# Patient Record
Sex: Male | Born: 1935 | Race: White | Hispanic: No | Marital: Single | Smoking: Former smoker
Health system: Southern US, Community
[De-identification: ages and names within clinical notes are randomized; demographics above are authoritative.]

## PROBLEM LIST (undated history)

## (undated) DIAGNOSIS — I447 Left bundle-branch block, unspecified: Secondary | ICD-10-CM

## (undated) DIAGNOSIS — D3709 Neoplasm of uncertain behavior of other specified sites of the oral cavity: Secondary | ICD-10-CM

## (undated) DIAGNOSIS — H3530 Unspecified macular degeneration: Secondary | ICD-10-CM

## (undated) DIAGNOSIS — D3705 Neoplasm of uncertain behavior of pharynx: Secondary | ICD-10-CM

## (undated) DIAGNOSIS — F419 Anxiety disorder, unspecified: Secondary | ICD-10-CM

## (undated) DIAGNOSIS — I495 Sick sinus syndrome: Secondary | ICD-10-CM

## (undated) DIAGNOSIS — M545 Low back pain, unspecified: Secondary | ICD-10-CM

## (undated) DIAGNOSIS — G609 Hereditary and idiopathic neuropathy, unspecified: Secondary | ICD-10-CM

## (undated) DIAGNOSIS — K589 Irritable bowel syndrome without diarrhea: Secondary | ICD-10-CM

## (undated) DIAGNOSIS — T7840XA Allergy, unspecified, initial encounter: Secondary | ICD-10-CM

## (undated) DIAGNOSIS — R350 Frequency of micturition: Secondary | ICD-10-CM

## (undated) DIAGNOSIS — M542 Cervicalgia: Secondary | ICD-10-CM

## (undated) DIAGNOSIS — K921 Melena: Secondary | ICD-10-CM

## (undated) DIAGNOSIS — G5600 Carpal tunnel syndrome, unspecified upper limb: Secondary | ICD-10-CM

## (undated) DIAGNOSIS — K219 Gastro-esophageal reflux disease without esophagitis: Secondary | ICD-10-CM

## (undated) DIAGNOSIS — G4700 Insomnia, unspecified: Secondary | ICD-10-CM

## (undated) DIAGNOSIS — D3701 Neoplasm of uncertain behavior of lip: Secondary | ICD-10-CM

## (undated) DIAGNOSIS — R55 Syncope and collapse: Secondary | ICD-10-CM

## (undated) DIAGNOSIS — L821 Other seborrheic keratosis: Secondary | ICD-10-CM

## (undated) DIAGNOSIS — Z950 Presence of cardiac pacemaker: Secondary | ICD-10-CM

## (undated) DIAGNOSIS — M25519 Pain in unspecified shoulder: Secondary | ICD-10-CM

## (undated) DIAGNOSIS — I2510 Atherosclerotic heart disease of native coronary artery without angina pectoris: Secondary | ICD-10-CM

## (undated) DIAGNOSIS — R5381 Other malaise: Secondary | ICD-10-CM

## (undated) DIAGNOSIS — R413 Other amnesia: Secondary | ICD-10-CM

## (undated) DIAGNOSIS — K279 Peptic ulcer, site unspecified, unspecified as acute or chronic, without hemorrhage or perforation: Secondary | ICD-10-CM

## (undated) DIAGNOSIS — K3184 Gastroparesis: Secondary | ICD-10-CM

## (undated) DIAGNOSIS — K5732 Diverticulitis of large intestine without perforation or abscess without bleeding: Secondary | ICD-10-CM

## (undated) DIAGNOSIS — M7741 Metatarsalgia, right foot: Secondary | ICD-10-CM

## (undated) DIAGNOSIS — M7742 Metatarsalgia, left foot: Secondary | ICD-10-CM

## (undated) DIAGNOSIS — D1739 Benign lipomatous neoplasm of skin and subcutaneous tissue of other sites: Secondary | ICD-10-CM

## (undated) DIAGNOSIS — H269 Unspecified cataract: Secondary | ICD-10-CM

## (undated) DIAGNOSIS — K220 Achalasia of cardia: Secondary | ICD-10-CM

## (undated) DIAGNOSIS — L723 Sebaceous cyst: Secondary | ICD-10-CM

## (undated) DIAGNOSIS — B409 Blastomycosis, unspecified: Secondary | ICD-10-CM

## (undated) DIAGNOSIS — M109 Gout, unspecified: Secondary | ICD-10-CM

## (undated) DIAGNOSIS — M1990 Unspecified osteoarthritis, unspecified site: Secondary | ICD-10-CM

## (undated) DIAGNOSIS — I255 Ischemic cardiomyopathy: Secondary | ICD-10-CM

## (undated) DIAGNOSIS — E119 Type 2 diabetes mellitus without complications: Secondary | ICD-10-CM

## (undated) DIAGNOSIS — I10 Essential (primary) hypertension: Secondary | ICD-10-CM

## (undated) DIAGNOSIS — R972 Elevated prostate specific antigen [PSA]: Secondary | ICD-10-CM

## (undated) HISTORY — DX: Pain in unspecified shoulder: M25.519

## (undated) HISTORY — DX: Neoplasm of uncertain behavior of pharynx: D37.05

## (undated) HISTORY — DX: Gout, unspecified: M10.9

## (undated) HISTORY — DX: Anxiety disorder, unspecified: F41.9

## (undated) HISTORY — DX: Sebaceous cyst: L72.3

## (undated) HISTORY — DX: Benign lipomatous neoplasm of skin and subcutaneous tissue of other sites: D17.39

## (undated) HISTORY — DX: Unspecified cataract: H26.9

## (undated) HISTORY — DX: Cervicalgia: M54.2

## (undated) HISTORY — DX: Irritable bowel syndrome, unspecified: K58.9

## (undated) HISTORY — PX: CATARACT EXTRACTION: SUR2

## (undated) HISTORY — DX: Type 2 diabetes mellitus without complications: E11.9

## (undated) HISTORY — DX: Neoplasm of uncertain behavior of other specified sites of the oral cavity: D37.09

## (undated) HISTORY — DX: Diverticulitis of large intestine without perforation or abscess without bleeding: K57.32

## (undated) HISTORY — DX: Metatarsalgia, left foot: M77.42

## (undated) HISTORY — DX: Presence of cardiac pacemaker: Z95.0

## (undated) HISTORY — DX: Essential (primary) hypertension: I10

## (undated) HISTORY — DX: Ischemic cardiomyopathy: I25.5

## (undated) HISTORY — DX: Insomnia, unspecified: G47.00

## (undated) HISTORY — DX: Neoplasm of uncertain behavior of lip: D37.01

## (undated) HISTORY — DX: Hereditary and idiopathic neuropathy, unspecified: G60.9

## (undated) HISTORY — DX: Carpal tunnel syndrome, unspecified upper limb: G56.00

## (undated) HISTORY — DX: Low back pain, unspecified: M54.50

## (undated) HISTORY — DX: Left bundle-branch block, unspecified: I44.7

## (undated) HISTORY — DX: Frequency of micturition: R35.0

## (undated) HISTORY — DX: Atherosclerotic heart disease of native coronary artery without angina pectoris: I25.10

## (undated) HISTORY — DX: Gastroparesis: K31.84

## (undated) HISTORY — DX: Other amnesia: R41.3

## (undated) HISTORY — DX: Elevated prostate specific antigen (PSA): R97.20

## (undated) HISTORY — DX: Other seborrheic keratosis: L82.1

## (undated) HISTORY — DX: Sick sinus syndrome: I49.5

## (undated) HISTORY — DX: Peptic ulcer, site unspecified, unspecified as acute or chronic, without hemorrhage or perforation: K27.9

## (undated) HISTORY — DX: Other malaise: R53.81

## (undated) HISTORY — DX: Blastomycosis, unspecified: B40.9

## (undated) HISTORY — PX: OTHER SURGICAL HISTORY: SHX169

## (undated) HISTORY — DX: Allergy, unspecified, initial encounter: T78.40XA

## (undated) HISTORY — DX: Metatarsalgia, right foot: M77.41

## (undated) HISTORY — DX: Melena: K92.1

## (undated) HISTORY — DX: Syncope and collapse: R55

## (undated) HISTORY — DX: Unspecified osteoarthritis, unspecified site: M19.90

## (undated) HISTORY — PX: EYE SURGERY: SHX253

## (undated) HISTORY — DX: Unspecified macular degeneration: H35.30

## (undated) HISTORY — DX: Gastro-esophageal reflux disease without esophagitis: K21.9

## (undated) HISTORY — DX: Achalasia of cardia: K22.0

## (undated) HISTORY — DX: Low back pain: M54.5

---

## 1974-03-28 HISTORY — PX: VASECTOMY: SHX75

## 1976-03-28 HISTORY — PX: NASAL SINUS SURGERY: SHX719

## 1993-03-28 HISTORY — PX: APPENDECTOMY: SHX54

## 1993-03-28 HISTORY — PX: SHOULDER ARTHROSCOPY W/ ROTATOR CUFF REPAIR: SHX2400

## 1996-07-26 HISTORY — PX: OTHER SURGICAL HISTORY: SHX169

## 1997-09-29 ENCOUNTER — Inpatient Hospital Stay (HOSPITAL_COMMUNITY): Admission: EM | Admit: 1997-09-29 | Discharge: 1997-10-01 | Payer: Self-pay | Admitting: Emergency Medicine

## 1997-12-26 HISTORY — PX: OTHER SURGICAL HISTORY: SHX169

## 1998-01-23 ENCOUNTER — Ambulatory Visit (HOSPITAL_COMMUNITY): Admission: RE | Admit: 1998-01-23 | Discharge: 1998-01-23 | Payer: Self-pay | Admitting: Cardiology

## 1998-01-23 ENCOUNTER — Encounter: Payer: Self-pay | Admitting: Cardiology

## 1998-02-23 ENCOUNTER — Ambulatory Visit (HOSPITAL_COMMUNITY): Admission: RE | Admit: 1998-02-23 | Discharge: 1998-02-23 | Payer: Self-pay | Admitting: Cardiology

## 1998-02-26 ENCOUNTER — Ambulatory Visit (HOSPITAL_COMMUNITY): Admission: RE | Admit: 1998-02-26 | Discharge: 1998-02-27 | Payer: Self-pay | Admitting: Cardiology

## 1998-02-26 ENCOUNTER — Encounter: Payer: Self-pay | Admitting: Cardiology

## 1998-04-28 HISTORY — PX: MINOR HEMORRHOIDECTOMY: SHX6238

## 1998-05-11 ENCOUNTER — Ambulatory Visit (HOSPITAL_COMMUNITY): Admission: RE | Admit: 1998-05-11 | Discharge: 1998-05-12 | Payer: Self-pay | Admitting: General Surgery

## 1999-07-24 ENCOUNTER — Inpatient Hospital Stay (HOSPITAL_COMMUNITY): Admission: EM | Admit: 1999-07-24 | Discharge: 1999-07-28 | Payer: Self-pay | Admitting: Emergency Medicine

## 1999-07-24 ENCOUNTER — Encounter: Payer: Self-pay | Admitting: Emergency Medicine

## 1999-07-26 ENCOUNTER — Encounter: Payer: Self-pay | Admitting: Internal Medicine

## 2001-05-23 ENCOUNTER — Ambulatory Visit (HOSPITAL_COMMUNITY): Admission: RE | Admit: 2001-05-23 | Discharge: 2001-05-24 | Payer: Self-pay | Admitting: Cardiology

## 2001-05-23 ENCOUNTER — Encounter: Payer: Self-pay | Admitting: Cardiology

## 2002-11-29 ENCOUNTER — Emergency Department (HOSPITAL_COMMUNITY): Admission: EM | Admit: 2002-11-29 | Discharge: 2002-11-29 | Payer: Self-pay | Admitting: Emergency Medicine

## 2003-01-24 ENCOUNTER — Ambulatory Visit (HOSPITAL_COMMUNITY): Admission: RE | Admit: 2003-01-24 | Discharge: 2003-01-24 | Payer: Self-pay | Admitting: Gastroenterology

## 2003-01-24 ENCOUNTER — Encounter (INDEPENDENT_AMBULATORY_CARE_PROVIDER_SITE_OTHER): Payer: Self-pay | Admitting: Specialist

## 2003-01-27 HISTORY — PX: CORONARY ARTERY BYPASS GRAFT: SHX141

## 2003-01-30 ENCOUNTER — Inpatient Hospital Stay (HOSPITAL_COMMUNITY): Admission: RE | Admit: 2003-01-30 | Discharge: 2003-02-14 | Payer: Self-pay | Admitting: Cardiovascular Disease

## 2003-02-07 ENCOUNTER — Encounter (INDEPENDENT_AMBULATORY_CARE_PROVIDER_SITE_OTHER): Payer: Self-pay

## 2003-03-10 ENCOUNTER — Encounter
Admission: RE | Admit: 2003-03-10 | Discharge: 2003-03-10 | Payer: Self-pay | Admitting: Thoracic Surgery (Cardiothoracic Vascular Surgery)

## 2003-03-19 ENCOUNTER — Ambulatory Visit (HOSPITAL_COMMUNITY): Admission: RE | Admit: 2003-03-19 | Discharge: 2003-03-19 | Payer: Self-pay | Admitting: Gastroenterology

## 2003-09-16 ENCOUNTER — Emergency Department (HOSPITAL_COMMUNITY): Admission: EM | Admit: 2003-09-16 | Discharge: 2003-09-16 | Payer: Self-pay | Admitting: *Deleted

## 2004-10-24 ENCOUNTER — Inpatient Hospital Stay (HOSPITAL_COMMUNITY): Admission: EM | Admit: 2004-10-24 | Discharge: 2004-10-29 | Payer: Self-pay | Admitting: Emergency Medicine

## 2004-10-28 HISTORY — PX: PACEMAKER INSERTION: SHX728

## 2005-07-20 DIAGNOSIS — R413 Other amnesia: Secondary | ICD-10-CM

## 2005-07-20 HISTORY — DX: Other amnesia: R41.3

## 2006-05-02 ENCOUNTER — Encounter (INDEPENDENT_AMBULATORY_CARE_PROVIDER_SITE_OTHER): Payer: Self-pay | Admitting: Specialist

## 2006-05-02 ENCOUNTER — Ambulatory Visit (HOSPITAL_COMMUNITY): Admission: RE | Admit: 2006-05-02 | Discharge: 2006-05-02 | Payer: Self-pay | Admitting: Surgery

## 2006-05-02 HISTORY — PX: OTHER SURGICAL HISTORY: SHX169

## 2008-02-07 ENCOUNTER — Emergency Department (HOSPITAL_COMMUNITY): Admission: EM | Admit: 2008-02-07 | Discharge: 2008-02-08 | Payer: Self-pay | Admitting: Emergency Medicine

## 2008-05-27 DIAGNOSIS — I2510 Atherosclerotic heart disease of native coronary artery without angina pectoris: Secondary | ICD-10-CM | POA: Insufficient documentation

## 2008-05-27 DIAGNOSIS — E119 Type 2 diabetes mellitus without complications: Secondary | ICD-10-CM

## 2008-05-27 DIAGNOSIS — I10 Essential (primary) hypertension: Secondary | ICD-10-CM

## 2008-05-27 DIAGNOSIS — J449 Chronic obstructive pulmonary disease, unspecified: Secondary | ICD-10-CM

## 2008-05-27 DIAGNOSIS — N400 Enlarged prostate without lower urinary tract symptoms: Secondary | ICD-10-CM

## 2008-05-27 DIAGNOSIS — B409 Blastomycosis, unspecified: Secondary | ICD-10-CM

## 2008-05-27 DIAGNOSIS — J4489 Other specified chronic obstructive pulmonary disease: Secondary | ICD-10-CM | POA: Insufficient documentation

## 2008-05-27 DIAGNOSIS — E785 Hyperlipidemia, unspecified: Secondary | ICD-10-CM | POA: Insufficient documentation

## 2008-05-27 HISTORY — DX: Atherosclerotic heart disease of native coronary artery without angina pectoris: I25.10

## 2008-05-28 ENCOUNTER — Ambulatory Visit: Payer: Self-pay | Admitting: Infectious Diseases

## 2008-05-28 ENCOUNTER — Telehealth: Payer: Self-pay | Admitting: Infectious Diseases

## 2008-05-29 ENCOUNTER — Encounter (INDEPENDENT_AMBULATORY_CARE_PROVIDER_SITE_OTHER): Payer: Self-pay | Admitting: Licensed Clinical Social Worker

## 2008-06-10 ENCOUNTER — Encounter (INDEPENDENT_AMBULATORY_CARE_PROVIDER_SITE_OTHER): Payer: Self-pay | Admitting: Licensed Clinical Social Worker

## 2008-06-12 ENCOUNTER — Telehealth: Payer: Self-pay | Admitting: Infectious Diseases

## 2008-06-23 ENCOUNTER — Ambulatory Visit: Payer: Self-pay | Admitting: Infectious Diseases

## 2008-10-08 ENCOUNTER — Encounter (INDEPENDENT_AMBULATORY_CARE_PROVIDER_SITE_OTHER): Payer: Self-pay | Admitting: *Deleted

## 2009-05-28 ENCOUNTER — Telehealth: Payer: Self-pay | Admitting: Gastroenterology

## 2010-01-08 IMAGING — CT CT PELVIS W/O CM
2 of 4 series · 17 of 46 positions shown, 19 images · non-contrast
Comparison: Plain films 02/07/2008.

CT ABDOMEN

CLINICAL DATA: Abdominal pain and distention.

CT ABDOMEN AND PELVIS WITHOUT CONTRAST
TECHNIQUE: Multidetector CT imaging of the abdomen and pelvis was
performed following the standard protocol without intravenous
contrast.

[Series 2: abd/pelv w/o 5.0 b31f st · axial · non-contrast · 0.75mm/px · z∈[+887,+1322]mm · 14 of 97 slices shown, 16 images]
[im 5/97  soft-tissue]
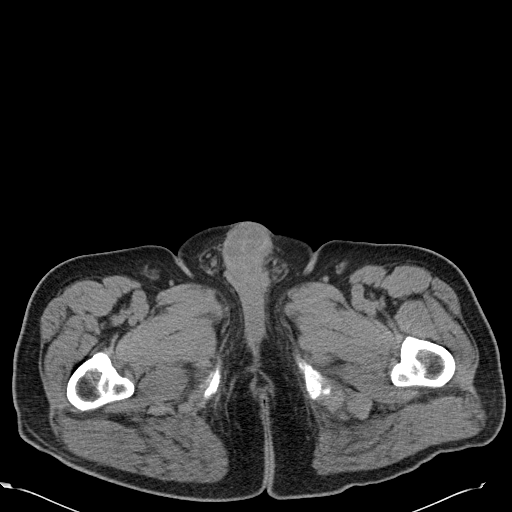
[im 5/97  bone]
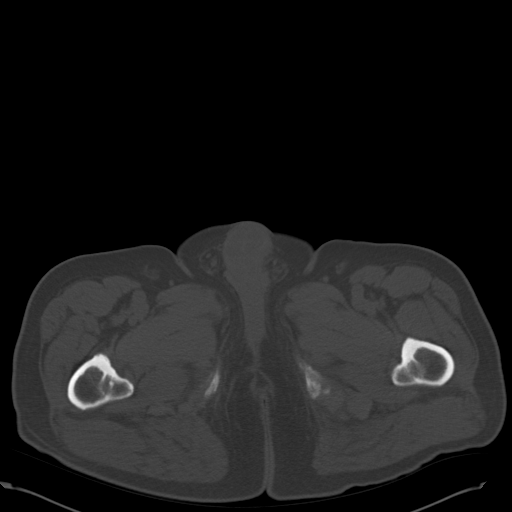
[im 14/97  soft-tissue]
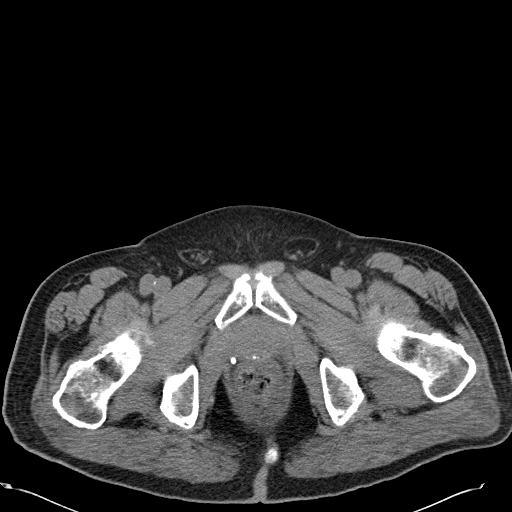
[im 18/97  soft-tissue]
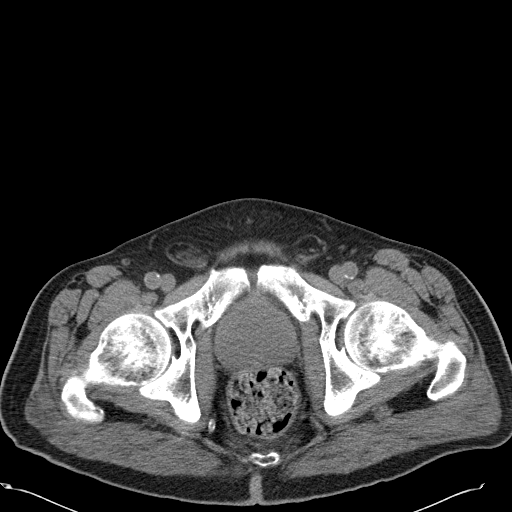
[im 27/97  soft-tissue]
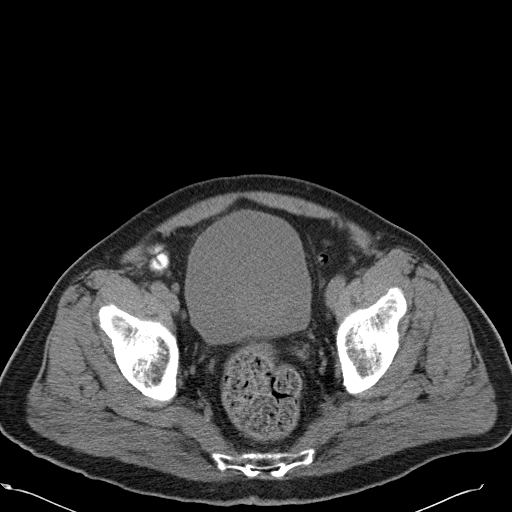
[im 31/97  soft-tissue]
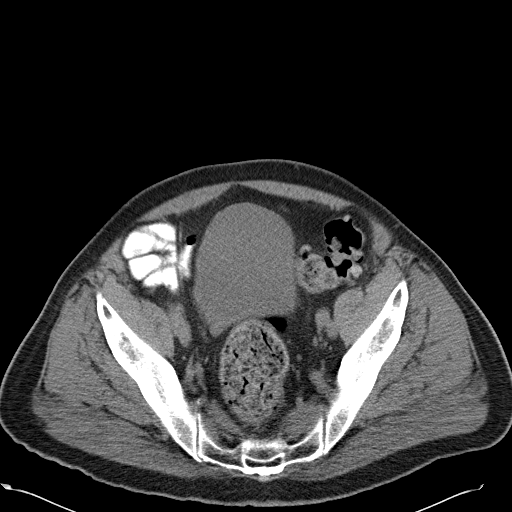
[im 40/97  soft-tissue]
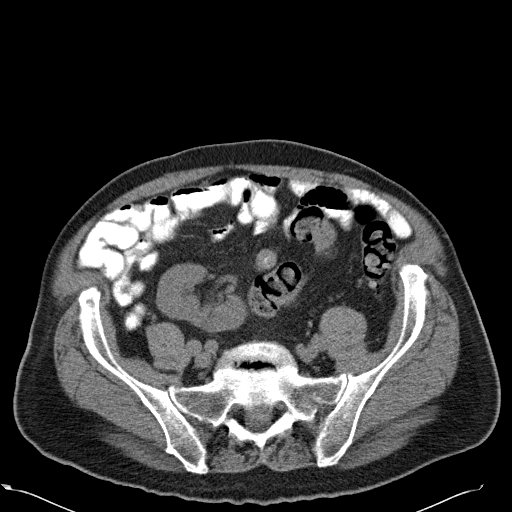
[im 44/97  soft-tissue]
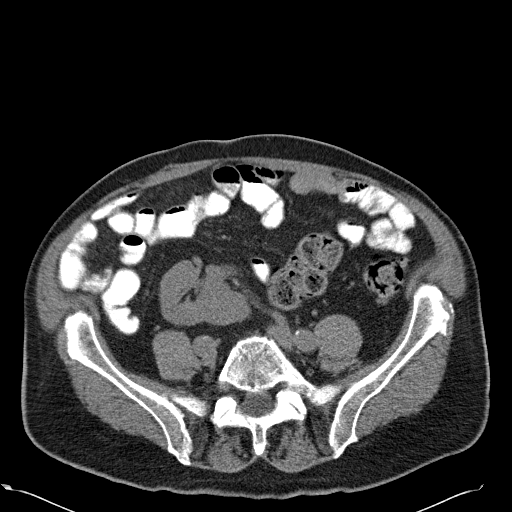
[im 53/97  soft-tissue]
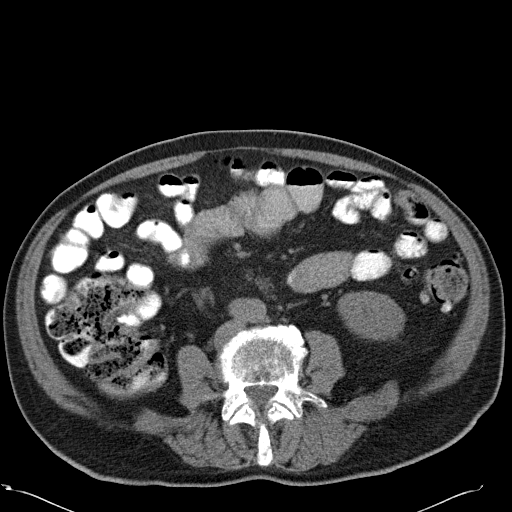
[im 57/97  soft-tissue]
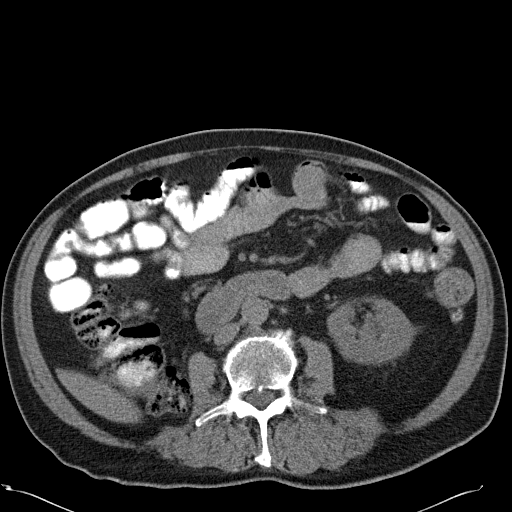
[im 57/97  bone]
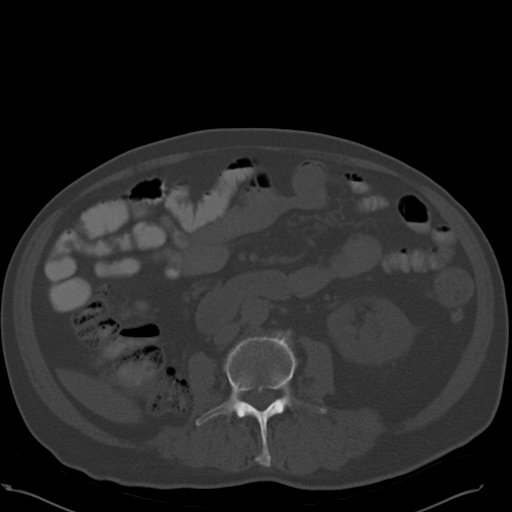
[im 66/97  soft-tissue]
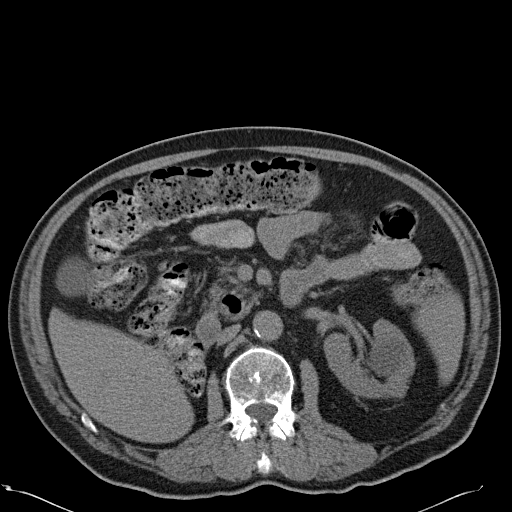
[im 70/97  soft-tissue]
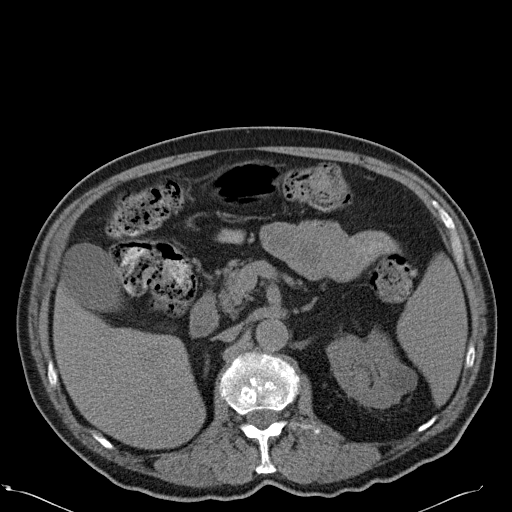
[im 79/97  soft-tissue]
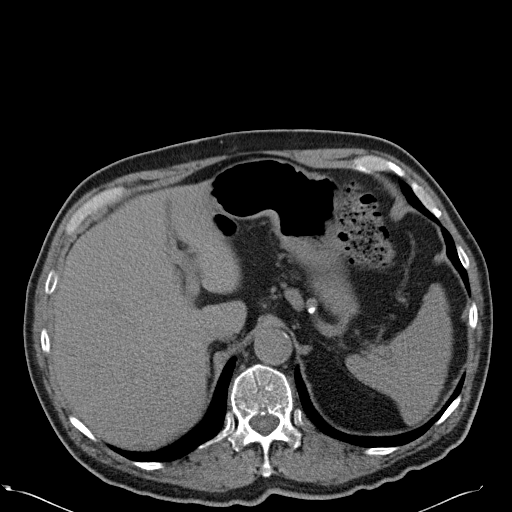
[im 83/97  soft-tissue]
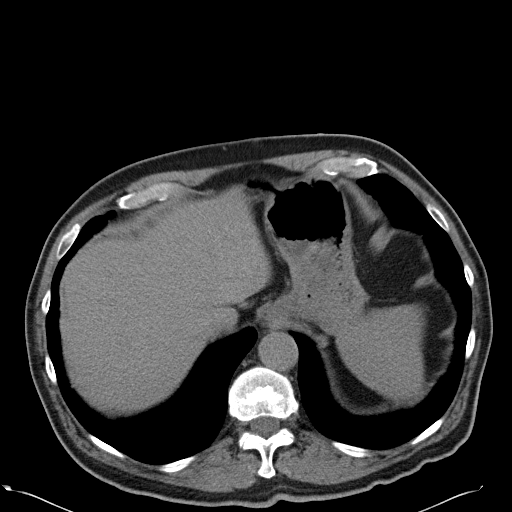
[im 92/97  soft-tissue]
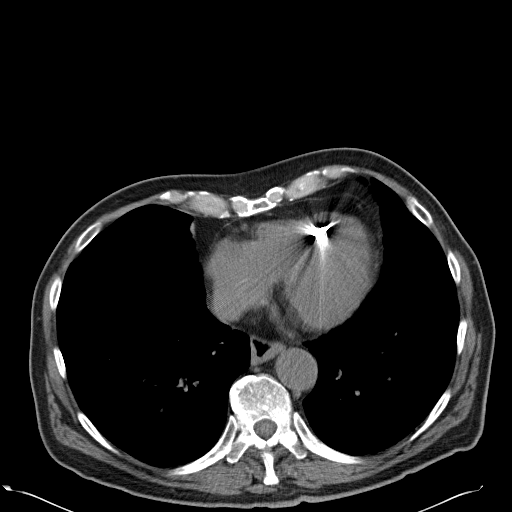

[Series 5: abd/pelv w/o 2.0 spo cor thins · coronal · non-contrast · 1.04mm/px · 3 of 128 slices shown]
[im 43/128  soft-tissue]
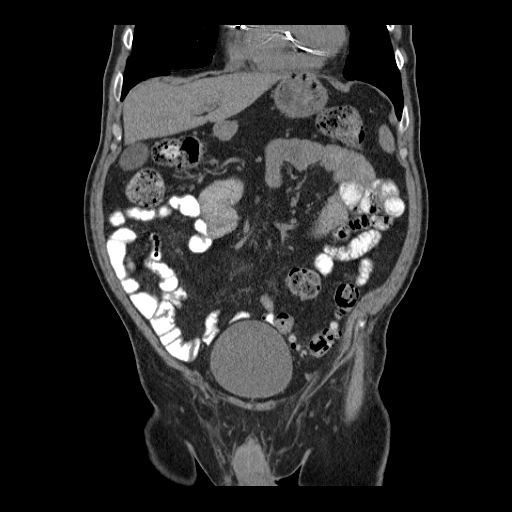
[im 57/128  soft-tissue]
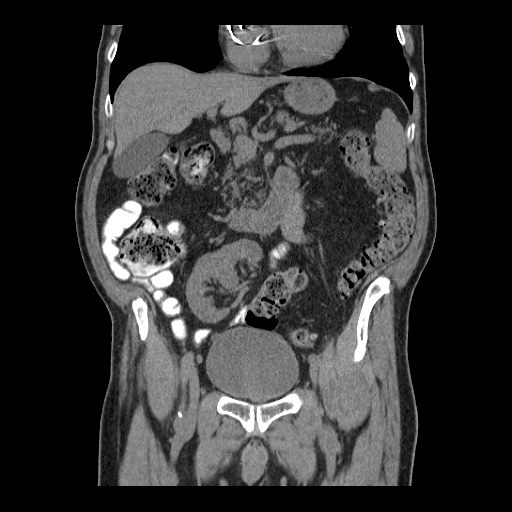
[im 71/128  soft-tissue]
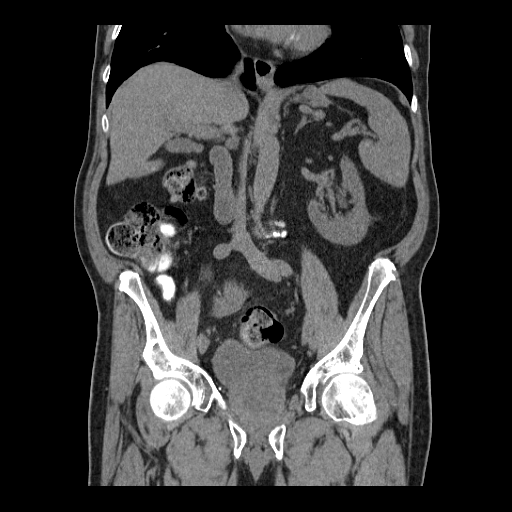

[17 of 46 positions shown; findings below may reference images not displayed]

FINDINGS: Lung bases are clear.  There is no pleural or
pericardial effusion.

There is no evidence of small bowel obstruction.  No pneumatosis,
portal venous gas or free intraperitoneal air is identified.  The
uninfused appearance of the liver, gallbladder, spleen, adrenal
glands and pancreas is unremarkable.  The patient has a ptotic
right kidney which is located in the pelvis.  Low attenuating
lesion in the left kidney measuring 3.5 cm likely a cyst.  The
kidneys are otherwise unremarkable.  No abdominal lymphadenopathy
or fluid collection.  There is no focal bony abnormality with
degenerative disease of the lumbar spine noted.
IMPRESSION: 1.  Negative for small bowel obstruction or acute abnormality.
2.  Pelvic right kidney.
3.  Left renal cyst.

CT PELVIS
FINDINGS: There is a large volume of stool present throughout the
colon, particularly the transverse and ascending colon.  Scattered
diverticula are noted but there is no evidence of diverticulitis.
The prostate gland is enlarged indenting the bladder base.  No
pelvic lymphadenopathy or fluid.  No focal bony abnormality.
IMPRESSION: 1.  Large stool burden particularly in the ascending and transverse
colon is noted.
2.  Marked enlargement the prostate gland.
3.  Diverticulosis without diverticulitis.

## 2010-04-17 ENCOUNTER — Encounter: Payer: Self-pay | Admitting: Thoracic Surgery (Cardiothoracic Vascular Surgery)

## 2010-04-18 ENCOUNTER — Encounter: Payer: Self-pay | Admitting: Infectious Diseases

## 2010-04-29 NOTE — Progress Notes (Signed)
Summary: Schedule NP3  Phone Note Outgoing Call Call back at [facility] Phone [phone]   Call placed by: Harlow Mares CMA Duncan Dull),  May 28, 2009 9:17 AM Call placed to: Patient Summary of Call: Left message on patients machine to call back. patient due for colonoscopy but due to age needs NP3 Initial call taken by: Harlow Mares CMA Duncan Dull),  May 28, 2009 9:18 AM  Follow-up for Phone Call        Left message on patients machine to call back.  Follow-up by: Harlow Mares CMA Duncan Dull),  June 04, 2009 3:31 PM

## 2010-08-13 NOTE — Discharge Summary (Signed)
Dustin Davila, Dustin Davila            ACCOUNT NO.:  [id number]   MEDICAL RECORD NO.:  [id number]          PATIENT TYPE:  INP   LOCATION:  2009                         FACILITY:  [facility]   PHYSICIAN:  Darcella Gasman. In[place], N.P.  DATE OF BIRTH:  1927/03/01   DATE OF ADMISSION:  10/24/2004  DATE OF DISCHARGE:  10/29/2004                                 DISCHARGE SUMMARY   DISCHARGE DIAGNOSES:  1.  Symptomatic bradycardia with lightheadedness and dizziness, heart rate      down to 34 and continuing to be so off negative inotropes.  2.  Left bundle branch block.  3.  Hypertension, controlled.  4.  Coronary artery disease with history of bypass grafting.  5.  Peripheral vascular disease.  6.  Dyslipidemia.   DISCHARGE CONDITION:  Improved.   PROCEDURES:  October 28, 2004, permanent pacemaker implanted, a Medtronic  Impulse [id number], serial [id number], Medtronic ventricular and atrial leads  placed in the left chest.   DISCHARGE MEDICATIONS:  1.  Enteric-coated aspirin 81 mg daily.  2.  Zocor 20 mg every evening.  3.  Prilosec 20 mg every evening.  4.  Lopressor half a tab twice a day.  5.  Zyrtec as before.  6.  Nitroglycerin sublingual p.r.n. chest pain.  7.  Cardura 4 mg every evening.   DISCHARGE INSTRUCTIONS:  1.  No work for two weeks.  2.  Increase activity slowly.  3.  See pacemaker discharge sheet.  4.  ALLERGY TO ALTACE.  5.  See Dr. Jenne Campus to check pacemaker November 11, 2004 at noon.  6.  Follow up with Dr. Elsie Lincoln November 19, 2004 at 9:15 a.m.   HISTORY OF PRESENT ILLNESS:  A 77 year old male patient of Dr. Truett Perna with  a history of hypertension, peripheral vascular disease, with subclavian  stenting in November 2004, elevated lipids, history of coronary artery  disease with history of angioplasty to the LAD in 1999 and in 2003, and then  underwent bypass grafting February 10, 2003 with LIMA to the LAD, saphenous  vein graft to the diagonal, normal circumflex and RCA, and a  normal EF at  that time.  He had done well until admission October 24, 2004.  He presented to  the ER.  He did stage over the last several months he had complained of  feeling lightheaded, dizzy and fatigued.  He had been taking his blood  pressure and pulse and over the last one to two days his heart rate was in  the low 40s though regular.  He denied any syncope or presyncope.   OUTPATIENT MEDICATIONS:  Zyrtec, aspirin, Cardura, Zocor, Lopressor and  Prilosec.   ALLERGIES:  IODINE.   FAMILY HISTORY, SOCIAL HISTORY, REVIEW OF SYSTEMS:  See H&P.   PHYSICAL EXAM:  At discharge, blood pressure 130/75, pulse 80, respirations  18, temperature 97.3, oxygen saturation 98%.  HEART:  S1, S2, regular rate and rhythm.  LUNGS:  Clear.  ABDOMEN:  Positive bowel sounds.  EXTREMITIES:  Without edema.  PACER SITE:  Dried blood at the incision, small amount of ecchymosis, no  edema.   ALLERGIES:  NOW ALLERGIC TO IODINE AS WELL AS ALTACE.   HOSPITAL COURSE:  Mr. Ore was admitted by Dr. Jenne Campus on call for Dr.  Elsie Lincoln October 24, 2004 with weakness, dizziness and low heart rate.  He was  placed on a monitor.  His Lopressor was held.  He was monitored for several  days.  Heart rate would drop as low as 37.  He continued with slow heart  rate 48 hours after stooping the Lopressor.  Heart rate never came above  50s.  It was decided by August 3rd to go ahead and place a permanent  pacemaker which was done by Dr. Lenise Herald.   On October 29, 2004, his pacemaker site was evaluated and the site was good  and the threshold and impedance were normal.   LABORATORY DATA:  Hemoglobin 13.2, hematocrit 38.9, WBC 8.5, platelets 173,  neutrophils 59, lymphs 32, monocytes 2, eosinophils 2, basophils 0.  Protime  14.2 INR 1.1.  PTT 29.  D-dimer 0.25.   Sodium 139, potassium 4, chloride 107, CO2 27, glucose 122, BUN 20,  creatinine 1.3, calcium 8.4, total protein 6.5, albumin 3.5, AST 27, ALT 26,  ALP 86, total  bilirubin 0.6, magnesium 2.1.  These remained fairly stable.  Glucose was mildly elevated throughout hospitalization.  Cardiac enzymes  were all negative, ranging 63-78, MBs 1.3-1.4 and troponin I 0.02-0.03.   BNP 39.6.   Total cholesterol 110, triglycerides 170, HDL 29 and LDL 47.   TSH 3.053.  UA was negative.   Chest x-ray showed no acute cardiopulmonary findings, stable operative  changes after his pacemaker.  Placement of left cardiac pacemaker, no  significant abnormality.  Lungs were clear with slight blunting of left  costophrenic sulci in the lateral view.   EKG:  The patient had EKGs.  I cannot find any prior to August 3 in the  chart.  He was in sinus rate, rate of 73.  Rate was faster from the previous  one of July 31 which is currently not the chart.  There was a left bundle  branch block.   In follow up, on August 4, bundle branch block, left axis deviation.   HOSPITAL COURSE:  As previously stated.  He was seen October 29, 2004 by Dr.  Elsie Lincoln.  After developing tongue tingling and immediate swelling, Altace was  held with resolution.  Chart should be noted allergy to ACE inhibitors.   The patient was discharged home without further problems.      Darcella Gasman. Annie Paras, N.P.     LRI/MEDQ  D:  01/01/2005  T:  01/01/2005  Job:  [id number]   cc:   Lenon Curt Chilton Si, M.D.  Fax: [phone]   Darlin Priestly, MD  Fax: [phone]   Madaline Savage, M.D.  Fax: [phone]

## 2010-08-13 NOTE — Op Note (Signed)
NAME:  Dustin Davila, [name]                      ACCOUNT NO.:  [id number]   MEDICAL RECORD NO.:  [id number]                   PATIENT TYPE:  INP   LOCATION:  2315                                 FACILITY:  [facility]   PHYSICIAN:  Salvatore Decent. Dorris Fetch, M.D.         DATE OF BIRTH:  10-03-1926   DATE OF PROCEDURE:  02/10/2003  DATE OF DISCHARGE:                                 OPERATIVE REPORT   PREOPERATIVE DIAGNOSES:  Severe single vessel coronary disease, status post  myocardial infarction.   POSTOPERATIVE DIAGNOSES:  Severe single vessel coronary disease, status post  myocardial infarction.   OPERATION PERFORMED:  Median sternotomy, off-pump coronary artery bypass  grafting times two (left internal mammary artery to left anterior  descending, saphenous vein graft to the first diagonal).   SURGEON:  Salvatore Decent. Dorris Fetch, M.D.   ASSISTANT:  Gwenith Daily. Tyrone Sage, M.D.   SECOND ASSISTANT:  Rowe Clack, P.A.-C.   ANESTHESIA:  General.   FINDINGS:  Left ventricular hypertrophy. Good quality targets, good quality  conduits.   INDICATIONS FOR PROCEDURE:  The patient is a 77 year old gentleman with a  history of coronary disease with previous PTCA and stenting of his LAD and  diagonal.  He presented to [facility] with an acute myocardial  infarction and at catheterization was found to have thrombus and restenosis  from the stents.  Angioplasty and additional stent placement was performed  initially with re-look catheterization; however, it was felt that the  patient's anatomy was not amenable to percutaneous intervention and coronary  artery bypass grafting would be preferred.  He was noted to have a 75% left  subclavian stenosis.  This was angioplastied and stented. While the patient  was waiting for coronary artery bypass grafting he developed  gastrointestinal bleeding requiring endoscopy which revealed a polyp in his  duodenum. This polyp was subsequently removed at a  second endoscopy.  Following removal of the polyp with discontinuation of heparin, the patient  had recurrent chest pain.  At this point the patient felt stable medically  to undergo coronary artery bypass grafting.  The indications, risks,  benefits and alternative procedures were discussed in detail with the  patient and he understood and accepted the risks.  The potential for off-  pump coronary artery bypass grafting was also discussed with the patient.  He again accepted and understood the potential advantages and disadvantages  of this approach.   DESCRIPTION OF PROCEDURE:  The patient was brought to the preop holding area  on February 10, 2004.  There the Anesthesia service placed lines to monitor  arterial, central venous and pulmonary arterial pressure.  Intravenous  antibiotics were administered.  The patient was taken to the operating room,  anesthetized and intubated.  A Foley catheter was placed and the chest,  abdomen and legs were then prepped and draped in the usual sterile fashion.   A median sternotomy was performed and the left internal mammary artery  harvested using standard technique.  Branches were doubly clipped and  divided with scissors.  The patient was fully heparinized prior to dividing  the distal end of the mammary artery. There was excellent flow through the  cut end of the vessel.  The mammary artery was placed on a papaverine soaked  sponge and placed into the left pleural space.  Simultaneously, an incision  was made in the medial aspect of the right leg at the level  of the ankle  and the greater saphenous vein was harvested from the lower leg using  standard open technique.  Vein graft was of good quality.   The pericardium was opened.  The ascending aorta was inspected and palpated.  There was no palpable atherosclerotic disease.  A pericardial cradle was  created.  The patient ws placed in Trendelenburg position and rotated to the  right side.  Of  note, a Bear Hugger warmer was placed over the patient's  lower body after harvesting of the vein graft.  With the patient in  Trendelenburg position, the heart was elevated and deep pericardial stay  sutures were placed running from the inferior vena cava to the left  superior pulmonary vein.  These were covered with Rumel tourniquets to  prevent epicardial laceration.  With traction on the deep pericardial  sutures, the apex of the heart prolapsed anteriorly.  The patient tolerated  the procedure well hemodynamically.  This allowed adequate exposure of the  diagonal and LAD vessels.  First diagonal was inspected and a site was  selected for grafting.  It was a 1.5 mm good quality target.  Silastic tapes  were placed proximal and distal to the site selected for anastomosis.  The  Genzyme stabilizing device was placed over the anastomotic site.  An  arteriotomy was performed.  Traction was placed on the proximal Silastic  tape.  The distal tape was not used.  There was good hemostasis and good  stabilization.  The saphenous vein graft was spatulated at its distal end  and was anastomosed end-to-side to the first diagonal with a running 7-0  Prolene suture in an end-to-side fashion.  At completion of the anastomosis,  a probe passed easily proximally and distally.  The anastomosis was deaired  and the graft was flushed with warm heparinized saline.  There was excellent  flow through the graft.  The stabilizer was removed.  Traction was released  on the deep pericardial sutures and the heart was allowed to rest in its  normal location.   A partial occlusion clamp was placed on the ascending aorta.  This was done  slowly with serial tightening of the clamp during diastole over several  cardiac cycles.  The systolic blood pressure was less than 120 at the time  of placement of the partial occlusion clamp.  A 4.4 mm punch aortotomy was performed and the proximal vein graft anastomosis was  performed to this  aortotomy with a running 6-0 Prolene suture.  At the completion of the  proximal anastomosis, the patient remained in Trendelenburg position.  The  bulldog clamp was removed from the vein graft to allow back-bleeding and  deairing.  The partial occlusion clamp was removed and air was allowed to  vent prior to tying the suture.  There was good hemostasis at both of these  anastomoses.   Next, a window was created in the pericardium.  The lima was brought through  the window and cut to length.  The distal end was  spatulated.  Traction was  once again placed on the deep pericardial stay sutures and again the patient  tolerated this well hemodynamically.  The LAD was well exposed although it  was not in a trough of epicardial fat.  With placement of the stabilizer and  Silastic tapes proximal and distal to the anastomosis, there was adequate  exposure using Parsonnet retractor on the epicardial fat.  An arteriotomy  was made in the LAD and the Silastic tape was tightened proximally.  However, the tape broke.  Therefore the arteriotomy was lengthened  proximally and distally and a 1.5 mm shunt was placed into the LAD without  difficulty.  This resulted in adequate hemostasis.  The left internal  mammary artery then was anastomosed end-to-side to the LAD using a running 8-  0 Prolene suture.  At the completion of the anastomosis before tying the  suture, the shunt was removed.  The bulldog clamp was released from the  mammary artery.  The anastomosis was deaired and then the suture was tied.  The stabilizer was removed.  The mammary pedicle was tacked to the  epicardial surface of the heart with 6-0 Prolene sutures.  The patient  tolerated this anastomosis well without any hemodynamic changes and without  any signs of ischemia by EKG.   Both distal anastomoses and the proximal anastomosis were inspected for  hemostasis.  The pericardial stay sutures were removed.  The  cardiac index  was greater than 2L per minute per meter squared.  A test dose of protamine  was administered and was well tolerated.  The remainder of the protamine was  administered without incident.  The chest was irrigated with 1L of warm  normal saline containing 1 gm of vancomycin.  Hemostasis was achieved.  The  pericardium was reapproximated with interrupted 3-0 silk sutures.  It came  together easily without tension.  A left pleural and two mediastinal chest  tubes were placed through separate subcostal incisions.  The sternum was  closed with heavy gauge interrupted stainless steel wires.  The remainder of  the incisions were closed in standard fashion with subcuticular closures  used for the skin.  All sponge, needle and instrument counts were correct at  the end of the  procedure.  There were no intraoperative complications.  The patient's  cardiac index remained stable after closure of the chest. The patient was taken from the operating room to the surgical intensive care unit in  critical but stable condition.                                               Salvatore Decent Dorris Fetch, M.D.    SCH/MEDQ  D:  02/10/2003  T:  02/10/2003  Job:  [id number]   cc:   Madaline Savage, M.D.  [address]  [place]  [place] [address]  Fax: [phone]   Lenon Curt. Chilton Si, M.D.  [address]  [place]  [place] [address]  Fax: [phone]   Lina Sar, M.D. [facility]

## 2010-08-13 NOTE — Cardiovascular Report (Signed)
NAMEJERIK, Dustin Davila                      ACCOUNT NO.:  [id number]   MEDICAL RECORD NO.:  [id number]                   PATIENT TYPE:  INP   LOCATION:  2910                                 FACILITY:  [facility]   PHYSICIAN:  Madaline Savage, M.D.             DATE OF BIRTH:  Apr 19, 1926   DATE OF PROCEDURE:  01/31/2003  DATE OF DISCHARGE:                              CARDIAC CATHETERIZATION   PROCEDURES PERFORMED:  1. Selective coronary angiography by Judkins technique.  2. Retrograde left heart catheterization.  3. Left ventricular angiography.  4. Abdominal aortography.  5. Intravascular ultrasound of the left anterior descending coronary artery     and of the first diagonal branch of the left anterior descending.  6. Selective visualization of the left subclavian artery and nonselective     visualization of the internal mammary artery.   COMPLICATIONS:  None.   ENTRY SITE:  Left femoral.   DYE USED:  Omnipaque.   MEDICATIONS GIVEN:  Heparin 2000 units IV twice resulting in an ACT of  approximately 280 seconds.   PATIENT PROFILE:  The patient is a 77 year old gentleman who has had  coronary disease first intervened upon in 1999 when a radius stent was  placed to the mid LAD and a NIR Primo stent was placed to the junction  between the mid and the proximal LAD.  A stent to the diagonal branch was  done in February of 2003, and yesterday the patient entered the  catheterization laboratory and underwent the above stated procedures by  Nicki Guadalajara, M.D. to his LAD and diagonal.   This morning, the patient has continued to have chest pain.  Cardiac enzymes  show a peak CK of 844, MB of 128, relative index at 15%, and troponin I of  19.2.  The data is compatible with acute myocardial infarction starting  yesterday with continued unstable angina today so he was brought back to the  catheterization laboratory elective.  Vital signs were stable.   RESULTS:  PRESSURES:  The left  ventricular pressure was 115/11, end-  diastolic pressure 18.  Central aortic pressure was 115/65, mean of 85.  No  aortic valve gradient by pullback technique.   ANGIOGRAPHIC RESULTS:  1. The left main coronary artery is normal.  2. The left anterior descending coronary artery coursed to the cardiac apex     and gave rise to one major diagonal branch and two medium sized septal     perforator branches.  The LAD contained three overlapping stents in     proximal and mid.  The most proximal stent was a Cypher.  The middle     stent was a NIR Primo and the distal stent was a Radius.  The overlap     site between Cypher and NIR stent showed an area of 75% stenosis that     appeared very much like it did yesterday before intervention was  performed.  There was TIMI-3 class flow.  There was some haziness in the     stent that could represent thrombus.  3. The diagonal showed TIMI-3 distal flow and the Hepacoat stent contained a     90% ostial stenosis, again, looking very much like it did before and     after yesterday's interventions.  4. The left circumflex was normal and trifurcated distally.  5. The right coronary artery was large and dominant with no significant     lesion.  Two acute marginal branches arose.  The distal vessel turned     into a posterior descending and a bifurcating posterolateral branch.  6. Left ventricular angiography showed good anterobasal and mid     anterolateral wall motion.  The apex, anterior apical wall, and     inferoapical wall showed moderate hypokinesis.  Ejection fraction     qualitatively was 55-60%, quantitatively was 53%.  7. The abdominal aortogram showed both renal arteries on the left to be     normal, abdominal aorta to be normal, and common iliacs to be normal.  I     did not see a right renal artery.  8. The left subclavian was normally proximally, but in the mid portion of     the vessel contained an eccentric 75% stenosis.  The internal  mammary     artery was normal.  9. Intravascular ultrasound of the LAD showed the reference portion of the     distal LAD to be 2.7 mm in diameter and more proximally the vessel became     3.0-3.2 mm in diameter.  The Radius stent appeared to be well deployed.     There was intimal hyperplasia noted.  The NIR stent was similarly well     deployed including the overlapped site with intimal hyperplasia.  The     Cypher stent was well deployed except at the overlapped site between it     and the NIR where there was noted to be an eccentric stenosis with     thrombus.  10.      The intravascular ultrasound of the Hepacoat stent showed it to be     well deployed, mild intimal hyperplasia and concentric stenosis at the     ostial portion of the diagonal, more normal appearance as it entered the     LAD.  11.      The patient had an ACT of 280 seconds during the procedure.  We     plan to continue his Integrilin and heparin.  We are going to get a     surgical consultation.  My fear about proceeding with percutaneous     balloon stenting of his LAD was that I would lose the diagonal.     Discussion with [name], M.D. and [name], M.D. was     obtained before my final decision was made.   FINAL DIAGNOSES:  1. Acute myocardial infarction, non-Q-wave, January 30, 2003.     a. 75% proximal in-stent restenosis of the left anterior descending.     b. 90% ostial restenosis of the diagonal.  2. Good left ventricular systolic function.  Ejection fraction 53%     quantitative and 55-60% qualitative.  3. Left subclavian stenosis 75%.   PLAN:  1. No further percutaneous intervention was performed nor is it planned for     today.  2. CVTS consult will be placed.  I have already spoken to __________.  3. Plavix will be stopped.  4.     Heparin and Integrilin will be continued for another 72 hours in hopes of    performing an IMA graft and possibly a separate vein graft to his      diagonal.  5. The patient should be considered for a subclavian stent either before     CABG or after CABG.                                               Madaline Savage, M.D.    WHG/MEDQ  D:  01/31/2003  T:  01/31/2003  Job:  [id number]   cc:   Cath Lab   CV[facility] Surgeons   [facility]

## 2010-08-13 NOTE — Op Note (Signed)
NAMETRAVERS, GOODLEY            ACCOUNT NO.:  [id number]   MEDICAL RECORD NO.:  [id number]          PATIENT TYPE:  INP   LOCATION:  2009                         FACILITY:  [facility]   PHYSICIAN:  Darlin Priestly, MD  DATE OF BIRTH:  [birth year]   DATE OF PROCEDURE:  10/28/2004  DATE OF DISCHARGE:                                 OPERATIVE REPORT   PROCEDURE:  Insertion of a Medtronic Impulse generator with passive  ventricular and active atrial leads.   ATTENDING PHYSICIAN:  Darlin Priestly, M.D.   COMPLICATIONS:  None.   INDICATIONS FOR PROCEDURE:  Mr. Frayre is a 77 year old male patient of Dr.  Lavonne Chick with a history of PVD status post left subclavian stenting in  November 2004, hyperlipidemia, history of CAD status post coronary artery  bypass surgery in November 2004 consisting of LIMA to LAD and vein to  diagonal.  The patient has a normal EF.  Recently, he has complained of  feeling light headed and fatigued with dizziness.  He  subsequently  presented to the ER where his heart rate was noted to be in the upper 30s to  low 40s.  He had all his negative or inotropics stopped but remained  bradycardic and symptomatic.  He is now referred for dual chamber pacer  implant for symptomatic bradycardia.   DESCRIPTION OF PROCEDURE:  After giving informed written consent, the  patient was brought to the cardiac catheterization lab in a fasting state.  The left anterior chest was then shaved, prepped and draped in a sterile  fashion.  ECG monitoring was established.  1% lidocaine was used to  anesthetize the left subclavian region.  An approximately 3 cm horizontal  incision was then carried out beneath the left subclavian.  Hemostasis was  obtained with electrocautery.  Blunt dissection was used to carry this down  to the left pectoral fascia.  An approximately 3 by 4 pocket was then  created over the left pectoral fascia and hemostasis obtained with  electrocautery.  Next,  the left subclavian vein was then entered using  fluoroscopy and contrast visualization of the left subclavian vein.  A guide-  wire was easily passed into the SVC and right atrium.  Four silk sutures  were then placed in the base of the pocket in order to secure the leads.  Next, a 9 French dilator and sheath were easily passed over the retained  guide-wire, the guide-wire and dilator were removed.  Following this, a 58  cm passive Medtronic lead, model number [id number], serial number [id number], was  then easily passed into the right atrium, the guide-wire was obtained, the  peel away sheath was removed.  A second 9 French dilator and sheath were  then passed over the retained guide-wire, and dilator and guide-wire were  removed.  A second 52 cm active atrial lead, model number 5076, serial  number [id number], was then easily passed into the right atrium and the  guide-wire retained and peel away sheath removed.  The guide-wire was then  fastened to the sheath with a hemostat.  A J-curve was then placed on  the  ventricular lead stylet.  The ventricular lead was allowed to prolapse  through the tricuspid valve and positioned in the RV apex without  difficulty.  Thresholds were determined.  R waves were measured at 3.5  millivolts, impedance 827 ohms.  Threshold on the ventricle was 0.4 volts at  0.5 milliseconds.  Current 0.5 milliamps.  A J-stylet was then placed on the  atrial lead and the atrial lead was positioned in the right atrial  appendage.  We were unable to capture at 2 volts, the screw was extended,  and thresholds were determined.  P waves were measured at 2.7 millivolts,  impedance was 983 ohms, threshold 0.7 volts at 0.5 milliseconds, current 0.8  milliamps.  10 volts negative in both the A and the V.  The 2-0 silk suture  was then fashioned to each lead and they were secured to the left pectoral  fascia.  The pocket was then copiously irrigated with 1% Kanamycin solution.  Next,  the leads were connected in a serial fashion to a Medtronic Impulse  [id number] generator, serial number [id number].  Set screws were tightened and  pacing was confirmed.  A single silk suture was then placed in the apex of  the pocket.  The pacer and wires were then delivered into the pocket and the  header was secured to the silk suture.  The subcutaneous layer was closed  using running 2-0 Vicryl.  The skin layers were closed using running 4-0  Vicryl.  Steri-Strips were applied and the patient was transferred to the  recovery room in stable condition.   CONCLUSION:  Successful implant of a Medtronic Impulse [id number] generator,  serial number [id number], with passive ventricular and active atrial leads.      Darlin Priestly, MD  Electronically Signed     RHM/MEDQ  D:  10/28/2004  T:  10/28/2004  Job:  [id number]   cc:   Madaline Savage, M.D.  [address], [address]  [place]  [place] [address]  Fax: [phone]

## 2010-08-13 NOTE — H&P (Signed)
[place]  Patient:    Dustin Davila, Dustin Davila             MRN: [id number] Adm. Date:  07/24/99 Attending:  Lenon Curt. Chilton Si, M.D. CC:         Th[place]. Daphine Deutscher, M.D.                         History and Physical  CHIEF COMPLAINT:  Abdominal pain starting on the evening prior to the day of admission.  HISTORY OF PRESENT ILLNESS:  This 77 year old white male was admitted through the emergency room for further treatment and evaluation of left abdomen and left lower quadrant pain.  The radiologist suspects that he has a splenic flexure diverticulitis with possible micro-perforation on a CT of the abdomen done during his evaluation in the emergency room today.  The patient claims his pain began yesterday evening in the left abdomen, left lower quadrant.  He thought he had  ureteral stone, which he has experienced in the past.  None was noted on the CT  examination.  He does, however, have a 1.0 to 2.0 mm stone in the right upper pole of the right pelvic kidney, and a prominent prostate.  The patient reports a dark black stool yesterday, but he has recently been taking a multivitamin with iron and blames it on that.  He had a chill last night, which  required a heating blanket.  He did not experience any more chills today.  He is unaware of any fever rises.  PAST MEDICAL HISTORY:  1. Positive for ureteral lithiasis in 1988.  2. A prior history of prostatitis.  3. He had a duodenal ulcer in 1988.  4. Esophagitis in 1988.  5. Esophageal stricture in 1995.  6. ________ treated with botulinum toxin in 1998.  7. Coronary artery disease in December 1999.  8. Hyperglycemia in February 1993.  9. Positive ANA at 1:320 titer. 10. Chronic back pain. 11. Vertigo. 12. Right vitreous floater in February 1995. 13. History of colon polypectomy by Dr. Ulyess Mort in August 1995.  PAST SURGICAL HISTORY: 1. In 1988, right ear implant by Dr.  Arvil Chaco. 2. In 1955, appendectomy. 3. In 1976, vasectomy. 4. In 1995, right shoulder repair by Dr. Colon Flattery. Harkins. 5. In 1978, sinus surgery by Dr. Anner Crete. 6. In May 1998, excised a thrombosed hemorrhoid, Dr. Gita Kudo. 7. In October 1999, heart catheterization and stent, Dr. Madaline Savage. 8. In February 2000, a hemorrhoidectomy, Dr. Maryagnes Amos.  PROCEDURE:  1. In February 1982, exercise tolerance test showed occasional PVC, otherwise     normal.  2. In June 1975, upper GI series and oral cholecystogram showed antral and     duodenal edema, without ulcerations, and a normal OCG.  3. In 1988, esophagogastroduodenoscopy confirmed a duodenal ulcer and esophagitis.  4. In 1988, a flexible sigmoid examination, normal.  5. In August 1995, esophagogastroduodenoscopy by Dr. Ulyess Mort, showing     gastritis and duodenitis.  6. In August 1995, a colonoscopy by Dr. Victorino Dike showed a polyp which was     excised.  7. In August 1995, esophageal dilatation by Dr. Victorino Dike.  8. In January 1997, and June 1997, ankle/brachial indices normal both times.  9. In December 1997, an EGD by Dr. Barbette Hair. Arlyce Dice, showed duodenitis and     gastroesophageal narrowing. 10. In July 1999, a heart catheterization done by Dr.  Gamble. 11. In August 1999, a colonoscopy reportedly normal. 12. On December 21, 1998, a CT of the brain unremarkable. 13. In October 1999, an adenosine Cardiolite study by Dr. Elsie Lincoln at [facility] showed apical ischemia with an ejection fraction of 58%.  CONSULTATION: 1. Dr. Maretta Bees. Vonita Moss, urology. 2. Dr. Barbette Hair. Arlyce Dice, gastroenterology. 3. Dr. Guadelupe Sabin, ophthalmology. 4. Dr. Renae Fickle D. Harkins, orthopedics. 5. Dr. Ronaldo Miyamoto L. Caddell, chiropractor. 6. Dr. Kristine Garbe. Ezzard Standing, ENT. 7. Dr. Gita Kudo, general surgery. 8. Dr. Elsie Lincoln, cardiology.  CURRENT MEDICATIONS: 1. Enteric-coated aspirin 325 mg q.d. 2. Lipitor 20 mg  q.d. 3. Ultram 50 mg up to four times q.d. p.r.n. pain. 4. Nitroglycerin 0.4 mg sublingual p.r.n. pain. 5. Cardura 8 mg 1/2 tablet nightly. 6. Tums p.r.n. 7. Sinutab p.r.n.  ALLERGIES/INTOLERANCE:  CODEINE (nausea).  IMMUNIZATIONS:  Tetanus and diphtheria in 1985.  FAMILY HISTORY:  Father died at age 87 of a myocardial infarction.  Mother died of cancer of the sinus and stomach.  Maternal aunt had cancer of the mouth. Paternal nephew had cancer of the stomach.  Maternal aunt had cancer of the bladder. One brother living.  A history of ureteral lithiasis.  Two sisters, one died with cancer of the breast, the other living with coronary artery disease and stent.  Three children, all living and well.  SOCIAL HISTORY:  Heavy Arboriculturist and farmer, living in [place].  No alcohol or tobacco.  REVIEW OF SYSTEMS:  A complete physical examination was done on July 13, 1999.  Other than chronic back discomfort, he basically appeared to be stable.  The laboratory work at that time was normal, including a blood count, a complete metabolic panel, prostatic specific antigen, TSH, and lipids (cholesterol 188). O2 saturation was 95% on room air.  The patient had had some mild dyspnea but no significant cough.  The dyspnea seemed to be more prominent with exertion. He denied any chest pain.  PHYSICAL EXAMINATION:  VITAL SIGNS:  Temperature 98 degrees, pulse 80, respirations 16, blood pressure  150/70.  GENERAL:  This is an alert, cooperative, and oriented white male, who is a good  historian.  SKIN:  Appears to be normal without ecchymoses, rash, or other abnormalities.  NODES:  None palpable in the cervical, axillary, inguinal, or other areas.  HEENT:  Pupils equal, round, reactive to light and accommodation.  Extraocular movements.  Fundi appear normal.  He has a hearing aid in the left ear.  His hearing is diminished bilaterally.  Pinna, external auditory  canals, and tympanic membranes are normal.  NECK:  Supple, no thyromegaly, nodules, masses, bruits, or adenopathy.   BREASTS:  Normal male.  CHEST:  Clear to auscultation.  HEART:  Regular sinus rhythm.  No gallop, murmur, click, or rub.  PMI in the left fifth intercostal space, midclavicular line.  ABDOMEN:  Distended and with increased tympany.  There is an old scar just to the right of the midline.  There is no organomegaly, masses, or bruits, or palpable  irregularities.  No hepatojugular reflux.  GENITOURINARY:  Normal male, with descended testicles.  Normal penis.  No herniae.  RECTAL:  Normal on July 13, 1999, except for 2+ benign prostatic hypertrophy without nodules.  BACK:  Mobile, the patient able to move up and down without obvious tenderness.  EXTREMITIES:  Full range of motion in all four.  A scar of the right shoulder.  NEUROLOGIC:  Grossly normal.  Cranial nerves intact with no evidence of tremor.  Speech is normal.  No asterixis, and previously-normal gait.  PULSES:  Are +4 and symmetric dorsalis pedis and posterior tibials.  IMPRESSION:  The patient has abdominal pain with abdominal distention and possible perforation of a diverticula in the splenic flexure per the radiologists report of the CT scan done on the day of admission.  PLAN:  The patient is being admitted.  He will receive parenteral antibiotics (initially Unasyn), and a surgical consultation.  I have discussed this situation with Dr. Molli Hazard B. Daphine Deutscher who is on call for Dr. Maryagnes Amos. DD:  07/24/99 TD:  07/24/99 Job: [id number]

## 2010-08-13 NOTE — Discharge Summary (Signed)
[place]  Patient:    Dustin Davila, Dustin Davila                   MRN: [id number] Adm. Date:  [id number] Disc. Date: [id number] Attending:  Terald Sleeper Dictator:   Lorin Picket. Claiborne Billings, N.P. CC:         Lenon Curt. Chilton Si, M.D.             Thorn[place] Park Daphine Deutscher, M.D.                           Discharge Summary  DATE OF BIRTH:  January 17, 1927  CHIEF COMPLAINT:  Abdominal pain.  HISTORY OF PRESENT ILLNESS:  A 77 year old male followed in primary care by Dr. Murray Hodgkins at [facility] office.  He presented to the emergency room of [facility] for evaluation due to left lower quadrant abdominal pain.  He was seen by the emergency department physician and CT scan was done.  Radiology suspected splenic flexure diverticulitis with possible microperforation per the CT scan of the abdomen.  Patient claimed pain began approximately 24 hours prior to this admission in the left abdomen. He thought he was having a kidney stone which he had experienced in the past. No kidney stone was noted per the CT scan examination.  He does have a 1 to 2 mm stone, right upper pole of the right pelvic kidney, and a prominent prostate.  Patient reported a dark black stool prior admission.  He had recently been taking multivitamin with iron.  He had some chills prior to admission and required a heating blanket.  He is unaware of any fever.  PAST MEDICAL HISTORY:  1. Positive for ureterolithiasis in 1988.  2. Prior history of prostatitis.  3. Duodenal ulcer in 1988.  4. Esophagitis in 1988.  5. Esophageal stricture in 1995.  6. Acolasia treated with botulinum toxin in 1998.  7. Coronary artery disease, December, 1999.  8. Hyperglycemia, February, 1993.  9. Positive ANA at 1:320 titer. 10. Chronic back pain. 11. Vertigo. 12. Right vitreous floater, February, 1995. 13. History of colon polypectomy by Dr. Terrial Rhodes, August, 1995.  PAST  SURGICAL HISTORY:  1. 1998, right ear implant, Dr. Anner Crete.  2. Appendectomy in 1955.  3. Vasectomy in 1976.  4. Right shoulder repair, Dr. Ollen Bowl, 1995.  5. Sinus surgery, Dr. Anner Crete, 1978.  6. Excision, thrombosed hemorrhoid, Dr. Maryagnes Amos, 1998.  7. Heart catheterization and stent placement, Dr. Elsie Lincoln, October, 1999.  8. Hemorrhoidectomy, Dr. Maryagnes Amos, February, 2000.  PROCEDURES:  1. February, 1982, exercise tolerance test showing occasional premature     ventricular contraction.  2. June, 1975, upper GI series, showing antral and duodenal edema with     ulcerations, normal OCG.  3. In 1988, upper endoscopy confirming duodenal ulcer and esophagitis.  4. In 1988, flexible sigmoidoscopy examination normal.  5. In 1995, upper endoscopy, Dr. Corinda Gubler, showing gastritis and duodenitis.  6. August, 1995, colonoscopy, Dr. Corinda Gubler, showing polyp which was excised.  7. August, 1995, esophageal dilation, Dr. Corinda Gubler.  8. January, 1997 and June, 1997, ankle brachial indices both normal.  9. December, 1997, upper endoscopy, Dr. Melvia Heaps, showing duodenitis and     gastroesophageal narrowing. 10. Heart catheterization, Dr. Elsie Lincoln, 1999, in July. 11. August, 1999, colonoscopy reportedly normal. 12. September, 2000, CT scan of the brain unremarkable. 13. October, 1999, Cardiolite study, Dr. Elsie Lincoln, showed apical ischemia with  ejection fraction of 58%.  CONSULTS:  1. Dr. Larey Dresser, urology.  2. Dr. Melvia Heaps, GI.  3. Dr. Stormy Fabian, ophthalmology.  4. Dr. Odette Fraction, orthopedics.  5. Dr. Freddrick March, chiropractor.  6. Dr. Dillard Cannon, ENT.  7. Dr. Jerelene Redden, general surgery.  8. Dr. Elsie Lincoln, cardiology.  ADMISSION MEDICATIONS:  1. Enteric-coated aspirin 325 mg daily.  2. Lipitor 20 mg daily.  3. Ultram 50 mg up to four times daily as needed for pain.  4. Nitroglycerin 0.4 mg sublingual as needed for pain in the chest.  5. Cardura 8 mg, 1/2 tablet daily.  6. Tums  as needed.  7. Sinutab as needed.  ALLERGIES:  CODEINE caused nausea.  IMMUNIZATIONS:  Tetanus and diphtheria, 1985.  FAMILY HISTORY:  Father died at age 57 of MI.  Mother died of cancer of the sinus and stomach.  Maternal aunt had cancer of the mouth.  A maternal nephew had cancer of the stomach.  Maternal aunt had cancer of the bladder.  One brother living, history of ureterolithiasis.  Two sisters, one dead with cancer of the breast and the other living with coronary artery disease and status post stent.  Three children all living and well.  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  No alcohol or tobacco abuse.  REVIEW OF SYSTEMS:  Complete physical exam done July 13, 1999.  Other than chronic back discomfort, patient appeared stable.  Laboratory work at that time was normal including CBC, metabolic panel, PSA, TSH, lipid panel with cholesterol noted 188.  O2 saturations were 95% on room air.  Patient had some mild dyspnea but no significant cough.  Dyspnea seemed prominent with exertion.  Denied chest pain.  PHYSICAL EXAMINATION:  VITAL SIGNS:  Temperature 98, pulse 80, respirations 16, blood pressure 150/70.  GENERAL:  Patient is alert and cooperative with exam.  Oriented x 3.  SKIN:  Normal without ecchymosis, rash, or other abnormalities.  NODES:  None palpable, cervical, axillary, inguinal, or other areas.  HEENT:  Eyes PERRLA.  Extraocular movements intact.  Fundi appears normal. Hearing aid noted in left ear.  Hearing is diminished bilaterally.  Tympanic membranes appear normal.  NECK:  Supple with no thyromegaly.  No nodules, masses, bruits, or cervical adenopathy noted.  BREASTS:  Normal male.  CHEST:  Clear with auscultation.  HEART:  Regular sinus rhythm without murmur, S3, or S4.  PMI, left fifth intercostal space, midclavicular line.  ABDOMEN:  Distended with increased tympany.  There is an old scar  just right  of the midline.  No organomegaly or masses noted.  No bruits or palpable irregularities.  GENITOURINARY:  Normal male with distended testicles.  Normal penis.  No hernias noted.  RECTAL:  Normal, July 13, 1999, except 2+ prostatic hypertrophy without nodules.  BACK:  Mobile with the patient able to move up and down without obvious tenderness.  EXTREMITIES:  Full range of motion x 4.  Scar on the right shoulder.  NEUROLOGIC:  Grossly normal.  Cranial nerves II-XII grossly intact.  No obvious unilateral or focal defects.  Pulses 4+ and symmetrical, dorsalis pedis and posterior tibialis.  HOSPITAL COURSE:  Patient was admitted with abdominal pain and distention, probable diverticulitis, and possible perforation after CT scan.  Placed on Unasyn antibiotic therapy.  A surgical consult obtained, Dr. Ashley Royalty.  He agreed with Unasyn antibiotic therapy and a liquid diet.  Stool noted heme negative.  Switched to oral antibiotic therapy  with Flagyl and Levaquin.  No indication of acute abdomen over the course of hospitalization.  With antibiotic therapy, patients abdominal pain resolved.  Full diet was restarted and the patient has tolerated this well.  He has been up and ambulating without difficulty.  LABORATORY DATA:  Follow-up CBC Jul 27, 1999, showed WBC normal, hemoglobin normal at 13.8, hematocrit slightly low at 37.8, MCHC high at 36.4, monocytes low at 4, other values essentially normal.  Follow-up abdominal x-rays, July 26, 1999, no acute or specific findings.  No free air.  CT scan of the abdomen and pelvis done July 24, 1999, found mild thickening and inflammatory changes seen around the left splenic flexure. Diverticula present in this area.  Finding suggestive of diverticulitis.  Low attenuation lesion, left kidney, difficult to characterize but thought to represent a complex cyst.  Question microperforation with a tiny extraluminal pocket of air.  However,  no gross free air demonstrated.  No abscess formation.  CT scan of the pelvis found degenerative changes of the spine. Right pelvic kidney with 1 to 2 mm stone in the right upper pole.  No ureteral calculi.  DISCHARGE MEDICATIONS:  1. Flagyl 500 mg three times daily for 10 more days post discharge.  2. Levaquin 500 mg once daily for 10 more days post discharge.  3. Cardura 4 mg daily at bedtime.  4. Zocor 40 mg daily at bedtime.  5. Protonix 40 mg once daily.  6. Ultram 50 mg four times daily as needed for pain.  7. Will restart aspirin 325 mg daily.  8. Will restart nitroglycerin 0.4 mg tablets as needed for angina.  DISCHARGE DIAGNOSES:  1. Diverticulitis, improving.  2. Benign prostatic hypertrophy, stable.  3. Coronary artery disease, stable.  SPECIAL DISCHARGE INSTRUCTIONS:  Patient should have a follow-up with Dr. Murray Hodgkins at [facility] office in approximately one month post discharge.  Per Dr. Daphine Deutscher, patient should have a contrast study done in approximately four weeks to document tics.  CONDITION AT DISCHARGE:  Stable. DD:  07/28/99 TD:  07/29/99 Job: [id number]

## 2010-08-13 NOTE — Consult Note (Signed)
NAME:  Dustin Davila, Dustin Davila                      ACCOUNT NO.:  [id number]   MEDICAL RECORD NO.:  [id number]                   PATIENT TYPE:  INP   LOCATION:  2910                                 FACILITY:  [facility]   PHYSICIAN:  Salvatore Decent. Dorris Fetch, M.D.         DATE OF BIRTH:  [birth year]   DATE OF CONSULTATION:  01/31/2003  DATE OF DISCHARGE:                                   CONSULTATION   REASON FOR CONSULTATION:  Severe single vessel coronary artery disease.   CHIEF COMPLAINT:  Chest pain and shortness of breath.   HISTORY OF PRESENT ILLNESS:  Dustin Davila is a 77 year old gentleman with a  known history of coronary artery disease with previous angioplasty and  stenting of his LAD and first diagonal with stent placement. He had his  first angioplasty in 1999, his repeat angioplasty in December of that year,  and subsequent angioplasty in February of 2003. At that time, he continued  to have some intermittent chest pains but did not interfere his activities.  Over the past several days leading up to admission, he had had waxing and  waning chest which he described as a heaviness or fullness in his chest  associated with shortness of breath. He had thought this might be  indigestion but it did persist. He was admitted to [facility] and did rule  in for myocardial infarction with peak CPK of 201 with MB of 19.4 and a  troponin of 1.52. The patient had additional chest pain yesterday afternoon  as well as this morning. He had cardiac catheterization by Dr. Nicki Guadalajara  yesterday when he was found to have in-stent restenosis of both the LAD and  the first diagonal. He did have transient clot noted in the diagonal which  subsequently resolved. Today, he had repeat cardiac catheterization by Dr.  Elsie Lincoln where he was noted to have a 90% in-stent restenosis of his diagonal  and a 75% stenosis of the LAD. He has a 75% left subclavian stenosis.  Ejection fraction was 55 to 60% with moderate  apical hypokinesis. He was  also noted to have nonvisualization of his right renal artery.   The patient currently is stable and pain free on Integrilin drip.   PAST MEDICAL HISTORY:  Significant for hypertension, diet controlled glucose  intolerance, hypercholesterolemia. He has a remote past history of a GI  bleed secondary to [facility] powder use, and he also has some benign prostatic  hypertrophy. He denies stroke, TIA, DVT, PE.   MEDICATIONS ON ADMISSION:  1. Cardura 4 mg p.o. daily.  2. Aspirin 81 mg daily.  3. Zocor 20 mg daily.  4. Prilosec 20 mg daily.   ALLERGIES:  He is allergic to CODEINE.   FAMILY HISTORY:  Noncontributory.   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  He does not smoke or drink. He lives with his wife.   REVIEW OF SYSTEMS:  Intermittent chest  pain, fullness, occasional  indigestion, dyspnea on exertion. No rest or nocturnal symptoms. No history  of excessive bleeding or bruising. Does occasionally have headaches. He does  have some hesitancy and urgency with urination that has not changed  recently. All other systems are negative.   PHYSICAL EXAMINATION:  GENERAL:  Dustin Davila is a well appearing 77 year old  white male in no acute distress.  VITAL SIGNS:  Blood pressure is 118/67, pulse 68 and regular, respirations  14.  GENERAL:  He is a well-developed, well-nourished, 77 year old white male in  no acute distress.  NEUROLOGICAL:  He is alert and oriented x3 and grossly intact.  HEENT:  Unremarkable.  NECK:  His neck is supple without thyromegaly, adenopathy, or bruits.  CARDIAC:  Has a regular rate and rhythm, normal S1 and S2; no murmurs, rubs,  or gallops.  LUNGS:  His lungs are clear with equal breath sounds bilaterally.  ABDOMEN:  Has a well healed right para midline incision. There is no  palpable abnormality. His abdomen is nontender.  EXTREMITIES:  His extremities are without clubbing, cyanosis, or edema.  He  has 2+ pulses throughout and symmetrical bilaterally. Blood pressure is  within 5 mmHg systolic bilaterally.   IMPRESSION:  Dustin Davila is a very nice 77 year old gentleman. He has  history of coronary artery disease dating back approximately five years. He  has had multiple interventions on his LAD and diagonal and now presents with  an acute MI. A new stent was placed in the LAD, but the patient had  recurrent pain and still has significant in-stent restenosis in both the LAD  and the diagonal. I am in agreement with Dr. Elsie Lincoln at this point that  coronary artery bypass grafting treatment most likely will have a good short  and long term outcome, both in terms of relief of symptoms as well as long  term myocardial revascularization. Additional percutaneous intervention  would be high risk and relatively low likelihood of long-term patency.   He does have a 75% left subclavian stenosis which if possible would be  preferable to address  prior to his bypass surgery with PTCA and stenting.   I discussed in detail with [facility]ila and his family the indications,  risks, benefits, and alternatives. They understand that coronary artery  bypass grafting using mammary artery as well as saphenous vein to his  diagonal would be associated with higher likelihood of long term patency.  They also understand the incisions to be used and need for general  anesthesia and the expected postoperative stay and recuperation time. We  discussed the possibility of off-pump coronary artery bypass grafting for  potential to decrease his risk for neurologic dysfunction and bleeding  should it be feasible, cardiopulmonary bypass for backup if technically  difficult. He is agreement to that procedure. We discussed the risks of  surgery which include but are not limited to death, stroke, MI, bleeding, possible need for transfusions, infections, as well as other organ system  dysfunction including respiratory,  renal, and GI complications as well as  the possibility of DVT, PE, or ulcers. He understands and accepts this risks  and agrees to proceed. After discussion with Dr. Elsie Lincoln, the plan will be to  proceed with PTA and stent of his left subclavian on Monday February 03, 2003, followed by off-pump coronary bypass grafting via median sternotomy on  Tuesday, November 9. If the patient were to develop unstable symptoms in the  interim, more urgent coronary artery bypass  grafting and potential for free  mammary use would be indicated.                                               Salvatore Decent Dorris Fetch, M.D.   SCH/MEDQ  D:  01/31/2003  T:  02/01/2003  Job:  [id number]   cc:   Lenon Curt. Chilton Si, M.D.  [address]  [place]  [place] [address]  Fax: [phone]

## 2010-08-13 NOTE — Discharge Summary (Signed)
[place]. [facility]  Patient:    Dustin Davila, Dustin Davila Visit Number: [id number] MRN: [id number]          Service Type: CAT Location: 6500 [id number] Attending Physician:  Ophelia Shoulder Dictated by:   Marya Fossa, P.A. Admit Date:  05/23/2001 Discharge Date: 05/24/2001   CC:         Lenon Curt. Cassell Clement, M.D.   Discharge Summary  DATE OF BIRTH:  10-06-1926  ADMISSION DIAGNOSES: 1. Recurrent angina. 2. Coronary artery disease. 3. Abnormal cardiac. 4. Hypertension. 5. Hyperlipidemia.  DISCHARGE DIAGNOSES: 1. Elective cardiac catheterization on May 23, 2001 with intervention    to culprit diagonal one vessel. 2. Recurrent angina. 3. Coronary artery disease. 4. Abnormal cardiac. 5. Hypertension. 6. Hyperlipidemia.  HISTORY OF PRESENT ILLNESS:  The patient is a very pleasant 77 year old male patient of Dr. Chanda Busing, who presents to the office on May 14, 2001 for follow up Cardiolite from May 04, 2001.  This was done because of symptoms of angina.  Cardiolite showed a mild amount of lateral wall ischemia, EF 53%.  It was felt to be low risk study.  However, the patient continued to have exertional chest discomfort.  He occasionally takes nitroglycerin and gets relief.  He does have a history of coronary artery disease in the past and has had prior LAD stenting.  He also has hypertension and hyperlipidemia.  Because of abnormal Cardiolite and recurrent angina we felt prudent to proceed with elective cardiac catheterization with Dr. Elsie Lincoln.  PROCEDURES:  Cardiac catheterization on May 23, 2001 by Dr. Chanda Busing.  COMPLICATIONS:  None.  HOSPITAL COURSE:  The patient was admitted to [facility] on May 23, 2001 for elective cardiac catheterization.  Preprocedure laboratory studies were within normal limits.  The patient underwent catheterization by Dr. Elsie Lincoln which showed a  patent stent in the LAD, 90% ostial diagonal stenosis felt to be culprit vessel. Circumflex okay.  RCA okay.  LV with mild anterolateral hypokinesis.  Dr. Elsie Lincoln bolused infused the patient with integrilin and stented the ostium of the diagonal with cutting balloon PTCA and a 2.5 x 13 mm BX velocity Hepacote stent.  The patient tolerated the procedure well and there were no complications.  On May 24, 2001 the patient remained stable.  Cardiac enzymes negative. EKG unchanged.  Hemoglobin 14.7, platelets 161. Potassium 4.3, BUN 12, creatinine 1.0.  The patient was felt stable for discharge to home on May 24, 2001.  DISCHARGE MEDICATIONS: 1. Aspirin 81 mg q.d. 2. Plavix 75 mg q.d. x1 month. 3. Zocor 20 mg q.d. 4. Altace 10 mg q.d. 5. Protonix 40 mg q.d. 6. Vioxx 25 mg q.d. 7. Nitroglycerin p.r.n. chest pain.  ACTIVITY:  No strenuous activity.  No lifting more than five pounds or driving for two days.  He may return to work next week.  DIET: Low fat.  Low cholesterol.  Low salt.  SPECIAL INSTRUCTIONS:  May shower.  He is asked to call the office for any problems or questions.  FOLLOW-UP: With Dr. Elsie Lincoln on June 13, 2001 at 11:45. Dictated by:   Marya Fossa, P.A. Attending Physician:  Ophelia Shoulder DD:  05/24/01 TD:  05/24/01 Job: [id number]

## 2010-08-13 NOTE — Cardiovascular Report (Signed)
NAME:  Dustin Davila, Dustin Davila                      ACCOUNT NO.:  [id number]   MEDICAL RECORD NO.:  [id number]                   PATIENT TYPE:  INP   LOCATION:  2910                                 FACILITY:  [facility]   PHYSICIAN:  [name], M.D.          DATE OF BIRTH:  1926/08/15   DATE OF PROCEDURE:  01/31/2003  DATE OF DISCHARGE:                              CARDIAC CATHETERIZATION   PROCEDURE:  Retrograde central aortic catheterization, left subclavian  angiogram and injection in PA and oblique projections, transstenotic  gradient measurement, left subclavian artery PTA and stent with 8 x 24  balloon expandable Genesis premounted stent post dilatation with 10 x 18-mm  balloon.  Heparin 3000 units IV administration, continued Integrelin IV.   BRIEF HISTORY:  Mr. Cuervo is a 77 year old gentleman with known coronary  artery disease and prior cutting balloon atherectomy and stenting of the  ostial and proximal diagonal on December 21, 2001 for recurrent angina with  well preserved systolic function.   Prior LAD stents 1999 (December 1999) redilatation after initial stent  placement July 1999.   The patient was admitted with unstable angina and underwent urgent  catheterization by Dr. Nicki Guadalajara.  He was found to have high grade ostial  stenosis of the stent to the diagonal and high grade eccentric calcific  stenosis just opposite the diagonal and distal to the diagonal origin.  He  had a complex interventional procedure by Dr. Tresa Endo on the p.m. on January 30, 2003 including kissing balloon angioplasty of the DX and LAD and  subsequent Cypher LAD stenting across the diagonal and diagonal and post  dilatation.   He suffered from recurrent chest pain during the night requiring  recatheterization by Dr. Elsie Lincoln January 31, 2003 in the setting of non-Q-  wave myocardial infarction with elevated enzymes.   The patient was found to have 90% ostial diagonal stenosis within  the stent  and high grade eccentric 75% stenosis just beyond the diagonal origin.  The  Cypher stent from the previous day was patent.  A near stent and radius  stent from previous 1999.  Intervention was patent.  This was felt to be a  fairly large second diagonal.  There was no significant right coronary  disease and EF 55-60%.   Subclavian angiography to assess the patency of the LIMA showed an eccentric  75-85% stenosis proximal to the LIMA and vertebral.   It was recommended that the patient undergo elective coronary artery bypass  graft with bypass to his diagonal and LAD.  It was felt best that the intact  LIMA should be used and Dr. Dorris Fetch agreed after cardiovascular surgical  consultation.  We felt it was best to proceed with subclavian stenting in  this setting to preserve the LIMA preoperatively.   Informed consent was obtained from the patient and family to proceed.  He  was brought to the second floor CP lab.  He was still  in the postabsorptive  state.  Heparin was held and Integrelin was running.  Plavix was on hold and  he had been on aspirin.  The right  groin was prepped, draped in the usual manner.  1% Xylocaine was used for  local anesthesia.  The patient was given 2 mg of Nubain for sedation.  The  CRFA was entered just lateral and above the prior puncture site with an 18  thin-wall needle and a 6 French short side arm sheath was inserted.  The  lesion was crossed with [facility] wire and a diagnostic 6 French right coronary  catheter.  Transstenotic gradient measurement was 40 mmHg.  Using exchange  technique, the sheath was exchanged for an upgraded long 80-cm bright tip  Cordis side arm sheath.  This was positioned with dilator carefully pulled  back across the subclavian stenosis.  The lesion was then crossed with an 8  x 24-mm Cordis balloon expandable Genesis premounted stent.  Sheath was  pulled back and this was positioned proximal to the left vertebral and  LIMA  across the stenosis.  It was deployed at 8 atmospheres for 24 seconds.  The  stent moved forward slightly and it was near the LIMA.  The balloon was  removed and replacement with an upgraded 10 x 18-mm Cordis PowerFlex balloon  which was inflated to 3 atmospheres and then the stent was carefully pulled  back several millimeters proximal to the LIMA and left vertebral.  Then was  fully expanded at 7 atmospheres for 29 seconds.  The balloon was then  removed.  Final injections demonstrated excellent angiographic result with  reduction of eccentric 80-85%  to O%.  There was minimal non apposition of  the very distal portion of the stent laterally.  However, this was a very  large part of the subclavian and we did not feel this was be a problem.  There was no dissection and the stent was well opposed.  The remainder of  the vessel there was excellent flow to the tortuous left antegrade flow in  the tortuous left vertebral and the LIMA which remained intact.  The guide  wire was pulled back.  The side arm sheath was exchanged for short 7 [place]  sheath.  The final ACT after giving 3000 units of heparin at the beginning  of the procedure was 210 seconds.  A side arm sheath was secured to prevent  migration and the patient was brought to the holding area for sheath removal  when ACT drops below 150 with Integrelin continued.  We will plan to  continue to Integrelin until his surgery since he has had ischemic problem  last a.m. post PCI with his anatomy as outlined.  We will restart his  heparin approximately four hours after the sheath is removed and if the  groin is stable and continued medical therapy pending elective coronary  artery bypass graft off Plavix early next week.   CATHETERIZATION DIAGNOSES:  1. Successful left subclavian artery percutaneous transluminal angioplasty     and stent for high grade left subclavian stenosis indicated for    preservation of LIMA to be used for  elective coronary artery bypass     graft.  2. Status post left anterior descending stent and diagonal left anterior     descending percutaneous coronary intervention January 30, 2003 with     subsequent recurrent ischemia, patent vessel with high grade stenosis on     restudy January 31, 2003.  3. Well  preserved left ventricular function.  4. Remote left anterior descending, NIR and radius stent mid left anterior     descending in 1999 with subsequent redilatation.  5. Successful diagonal cutting balloon atherectomy and stent May 23, 2001.                                               [name], M.D.    RAW/MEDQ  D:  01/31/2003  T:  02/01/2003  Job:  [id number]   cc:   Salvatore Decent. Dorris Fetch, M.D.  [address]  [place]  [place] [address]

## 2010-08-13 NOTE — Cardiovascular Report (Signed)
[place]. [facility]  Patient:    Dustin Davila, Dustin Davila Visit Number: [id number] MRN: [id number]          Service Type: CAT Location: 6500 [id number] Attending Physician:  Ophelia Shoulder Dictated by:   Madaline Savage, M.D. Proc. Date: 05/23/01 Admit Date:  05/23/2001   CC:         Lenon Curt. Cassell Clement, M.D.  Cardiac Catheterization Lab at [facility]   Cardiac Catheterization  PROCEDURES PERFORMED: 1. Selective coronary angiography by Judkins technique. 2. Retrograde left heart catheterization. 3. Left ventricular angiography. 4. Cutting balloon angioplasty of the ostium of the first diagonal branch    of the left anterior descending artery. 5. Percutaneous coronary stenting of the ostium of the first diagonal branch    of the left anterior descending artery.  CARDIOLOGIST:  Madaline Savage, M.D.  COMPLICATIONS:  None.  ENTRY SITE:  Right femoral.  DYE USED:  Omnipaque.  MEDICATIONS GIVEN:  Heparin 5000 units and Integrilin by double bolus technique and continuous infusion.  He also received IV nitroglycerin at 3 cc an hour.  PATIENT PROFILE:  The patient is a very pleasant 77 year old gentleman who had been having symptoms of arm tingling and shortness of breath for about six months, worse lately.  A nuclear medicine stress test had shown evidence of lateral wall ischemia and a left ventricular ejection fraction of 53%.  Past history was that the patient had an LAD stent placed September 30, 1997, and had restenosis of that same stent in December and had it re-dilated.   Todays outpatient procedure was performed electively.  RESULTS:  PRESSURES:  The left ventricular pressure was 140/18,  central aortic pressure was 135/70, mean of 97.  No significant aortic valve gradient by pullback technique.  ANGIOGRAPHIC RESULTS:  The left main coronary artery was normal.  There was a radiopaque stent in the left anterior descending coronary artery  that extended from just beyond the first septal perforator branch to well beyond the second septal perforator branch and right above the third septal perforator branch in the LAD.  No in-stent restenosis was observed, and the distal outflow from this stent was TIMI-3 class flow and was considered normal.  The first diagonal branch of the LAD contained a 90% ostial stenosis in a vessel that was considered to be 2.5 mm in diameter and fairly long in its distribution.  This was an ostial stenosis.  The left circumflex coronary artery was a nondominant vessel giving rise to a trifurcating distal circumflex with normal-appearing distal branches.  The right coronary artery was a dominant vessel containing only luminal irregularities throughout.  The posterior descending branch was very small, and no lesions were seen.  The left ventricle showed normal contractility except for the high anterolateral wall which was moderately hypokinetic in a small segment. Ejection fraction estimated at 55%.  No mitral regurgitation or LV thrombus was seen.  INTERVENTIONAL REPORT:  This procedure was performed using a #7 introducer sheath, a short Patriot wire, an Amplatz #1 guide catheter with side holes, and two interventional devices.  The first device was a cutting balloon, 2.5 mm in diameter and 10 mm in length which was used to provide relief from the ostial stenosis so that the stent would cross.  Two inflations were performed at the ostium and then at the proximal portion of the vessel to a peak inflation pressure of 6 atmospheres corresponding to an estimated diameter of 2.5 mm.  This improved  the stenosis from 90% to about 30.  Next, a 2.5 mm x 13 mm Bx Velocity heparin-coated stent was deployed into the ostium of the vessel, making sure in multiple views with multiple angulations that we did not encroach on the LAD lumen.  After a single inflation to 10 atmospheres (I did not go to higher  atmospheres because the cutting balloon was used to predilate the vessel.), and the result was excellent.  There was a smooth lumen throughout the ostium which was equal in diameter to the remainder of the vessel, and no encroachment was seen on the LAD lumen in multiple views.  The procedure was considered an angiographic success.  The patient had little or no chest pain during the procedure, and no rhythm or blood pressure abnormalities were noted.  FINAL DIAGNOSES: 1. Patency of left anterior descending artery stent status post angioplasty    twice in 1999. 2. Recent chest pain and abnormal Cardiolite in the lateral wall    corresponding to an ostial diagonal stenosis of 90%. 3. Cutting balloon angioplasty and then stenting of the ostial diagonal    with a 90% lesion reduced to 0% residual. 4. Normal left ventricular systolic function, ejection fraction 55%, with    mild anterolateral wall hypokinesis.  PLAN:  Plavix for a month.  Continue beta blockers, ACE inhibitors, and clinical followup in the office. Dictated by:   Madaline Savage, M.D. Attending Physician:  Ophelia Shoulder DD:  05/23/01 TD:  05/23/01 Job: [id number]

## 2010-08-13 NOTE — Discharge Summary (Signed)
NAME:  Dustin Davila, Dustin Davila                      ACCOUNT NO.:  [id number]   MEDICAL RECORD NO.:  [id number]                   PATIENT TYPE:  INP   LOCATION:  2008                                 FACILITY:  [facility]   PHYSICIAN:  Salvatore Decent. Dorris Fetch, M.D.         DATE OF BIRTH:  07/06/1926   DATE OF ADMISSION:  01/30/2003  DATE OF DISCHARGE:  02/14/2003                                 DISCHARGE SUMMARY   HISTORY OF PRESENT ILLNESS:  Dustin Davila is a 77 year old male recently  evaluated at the [facility] for chest pain  complaints.  He presented on January 30, 2003 with symptoms of both chest  pain and fullness associated with shortness of breath that began about a  week ago.  He has felt poorly.  He took nitroglycerin for relief for a short  period of time but discomfort would return.  Also noted was that the  shortness of breath increased with exertion.  He also had some continuous  nausea.  The patient has a recent diagnosis of achalasia and received a  Botox injection during endoscopy on the 29th by Barbette Hair. Arlyce Dice, M.D. [facility]  He was felt to require admission to the hospital for further evaluation and  treatment including cardiac catheterization.   PAST MEDICAL HISTORY:  1. Coronary artery disease status post previous stent of LAD 1999 with     repeat in 2003.  He also has a 90% ostial diagonal lesion and underwent     angioplasty, cutting balloon, and Hepacoat stenting.  2. Achalasia.   MEDICATIONS ON ADMISSION:  1. Prilosec 20 mg daily.  2. Cardura 4 mg q.h.s.  3. Aspirin 81 mg daily.  4. Zocor 20 mg daily.   ALLERGIES:  CODEINE.   FAMILY HISTORY:  Please see the history and physical done at the time of  admission.   SOCIAL HISTORY:  Please see the history and physical done at the time of  admission.   REVIEW OF SYSTEMS:  Please see the history and physical done at the time of  admission.   PHYSICAL EXAMINATION:  Please see the history and  physical done at the time  of admission.   HOSPITAL COURSE:  The patient was admitted and on January 30, 2003 was taken  to the cardiac catheterization laboratory for catheterization and PCI.  The  patient initially did well from the procedure, but developed recurrent chest  pain as well as elevation in his CPK isoenzymes necessitating a second look  procedure on January 31, 2003.  Results of this procedure included finding  severe coronary artery disease with in-stent stenosis of the LAD as well as  in-stent stenosis of the diagonal lesions.  The right coronary artery and  left circumflex were okay.  Ejection fraction was 55-60% with evidence of  inferoapical and anteroapical hypokinesia.  The patient was felt to have  findings consistent with non Q-wave MI on January 30, 2003.  Coronary  artery  bypass grafting was recommended.  Additional finding included on the  catheterization was a left subclavian stenosis and this subsequently  underwent a procedure in the peripheral vascular laboratory by Richard A.  Alanda Amass, M.D. with a stent and subsequent balloon angioplasty done.  This  was in preparation for left internal mammary artery grafting.  The patient  was initially clinically stable and surgery was scheduled.  However, he  developed a gastrointestinal bleed.  GI consultation was subsequently  obtained and treatment included endoscopy which was done by Lina Sar,  M.D. [facility] on February 04, 2003.  This found a bleeding polypoid mass in the  duodenum which was biopsied and bleeding was controlled.  Biopsy has  subsequently revealed the mass to be what appeared to be benign but there  were mixed findings on the pathology that did include some evidence of  atypia.  The patient was overall felt to be medically stable from the  cardiac surgery and on February 10, 2003 the patient was taken to cardiac  operating room and underwent the following procedure.   PROCEDURE:  Off pump coronary  artery bypass grafting x2.  The following  grafts were placed:  Left internal mammary artery to the left anterior  descending, saphenous vein graft to the diagonal.  The patient tolerated the  procedure well.  Was taken to the surgical intracoronary in stable  condition.   POSTOPERATIVE HOSPITAL COURSE:  The patient has done quite well.  He has  remained stable from a hemodynamic viewpoint.  He has required moderate  pulmonary toilet, but has responded well.  He did have a mild anemia  postoperatively that was followed.  All routine lines, monitors, and  drainage devices were discontinued in a standard fashion.  The patient has  been weaned from oxygen and maintains good saturations on room air.  He is  maintaining normal sinus rhythm with no significant cardiac dysrhythmias or  ectopy.  He has responded well to a gentle diuresis, however, will require  further as an outpatient.  The patient's incisions are healing well without  signs of infection.  Abdominal examination has been benign and he is having  normal bowel movements with no evidence of further GI bleeding.  Most recent  hemoglobin and hematocrit is stable on February 13, 2003 at 10.1 and 29.6.  Overall, the patient is felt to be stable for discharge in the morning of  February 14, 2003.   DISCHARGE MEDICATIONS:  1. Lasix 40 mg daily for seven days.  2. K-Dur 20 mEq daily for seven days.  3. Lopressor 25 mg b.i.d.  4. Cardura 4 mg q.h.s.  5. Zocor 20 mg daily at bedtime.  6. Aspirin 81 mg daily.  7. Prilosec.  Resume home dose.  8. For pain, Ultram 50 mg q.6h. as needed.   DISCHARGE INSTRUCTIONS:  The patient will receive written instructions in  regard to medications, activity, diet, wound care, and follow-up.  Follow-up  will include Salvatore Decent. Dorris Fetch, M.D. on Monday, December 13 at 11 a.m.  Additionally, he is instructed to arrange follow-up with Madaline Savage, M.D. two weeks; Lina Sar, M.D. [facility] or Barbette Hair.  Arlyce Dice, M.D. [facility],  gastroenterology as they request.   CONDITION ON DISCHARGE:  Stable and improved.   FINAL DIAGNOSES:  1. Severe left anterior descending and diagonal coronary artery disease.  2. Achalasia.  3. Duodenal polyp.  4. Hypercholesterolemia.  5. Preoperative gastrointestinal bleed.  6. Mild postoperative anemia, stable.  Rowe Clack, P.A.-C.                    Salvatore Decent Dorris Fetch, M.D.    Sherryll Burger  D:  02/14/2003  T:  02/14/2003  Job:  [id number]   cc:   Madaline Savage, M.D.  [address]  [place]  [place] [address]  Fax: [phone]   Lina Sar, M.D. [facility]   Lenon Curt. Chilton Si, M.D.  [address]  [place]  [place] [address]  Fax: [phone]

## 2010-08-13 NOTE — Op Note (Signed)
NAMESIR, MALLIS            ACCOUNT NO.:  [id number]   MEDICAL RECORD NO.:  [id number]          PATIENT TYPE:  AMB   LOCATION:  SDS                          FACILITY:  [facility]   PHYSICIAN:  Ardeth Sportsman, MD     DATE OF BIRTH:  08-14-1926   DATE OF PROCEDURE:  05/02/2006  DATE OF DISCHARGE:                               OPERATIVE REPORT   PREOPERATIVE DIAGNOSES:  1. Painful nodules of left arm x 3.  2. Concerning skin lesion, left arm.   POSTOPERATIVE DIAGNOSES:  1. Painful nodules of left arm x 3.  2. Concerning skin lesion, left arm.   PROCEDURES PERFORMED:  1. Excision of painful subcutaneous nodules x 3.  2. Excision of concern left arm skin lesion.   SURGEON:  Ardeth Sportsman, MD.   ANESTHESIA:  Local anesthetic (1% lidocaine mixed equally with  epinephrine and bupivacaine).   SPECIMENS:  1. Subcutaneous nodules x3: left anterolateral, left anteromedial and      left posteromedial skin lesions.  2. A posterolateral left arm skin lesion.   DRAINS:  None.   ESTIMATED BLOOD LOSS:  Less than 2 mL.   COMPLICATIONS:  None apparent.   INDICATIONS:  Dustin Davila is a 77 year old gentleman, who has had a  history of subcutaneous nodules on both arms and thigh.  They usually do  not bother him.  Over the left arm, he has 2 medial ones that are  particularly bothersome, and an anterior one that is bothersome as well.  He also has felt a crusty lesion on his lateral posterior arm on the  left that has been concerning to him.  Options were discussed and  recommendation was made for excision of these subcutaneous masses.  The  differential diagnosis was explained.  Recommendation was made for  excision and closure.  Risks such as stroke, heart attack, deep vein  thrombosis, pulmonary embolism and death were discussed.  Risks such as  bleeding, hematoma, wound infection, abscess, injury to other organs,  need for reexcision, recurrence of masses and other risks were  discussed.  He agreed to proceed.   OPERATIVE FINDINGS:  The 3 subcutaneous nodules seemed to be fatty, but  somewhat fibrous as well, consistent with probable lipomata.  The  posterolateral skin lesion on the arm is concerning for a squamous cell  carcinoma versus actinic keratosis.   DESCRIPTION OF PROCEDURES:  Informed consent was confirmed.  The patient  was positioned.  His left arm was prepped and draped circumferentially  in a sterile fashion.  Field block of local anesthetic was infiltrated  at the 4 concerning areas after they had been marked and confirmed as  the lesions the patient wished to have removed since he also has  numerous other ones on his left arm as well as other parts of his body.  Over each 3 nodules, a longitudinal 2-cm incision was made through the  dermis.  Blunt dissection would be done circumferentially in the  subcutaneous tissues to enucleate a fibrofatty mass.  I was able to  remove these out sharply.  Pressure was held for 5 minutes  each, and the  skin was closed using 4-0 Monocryl in interrupted, buried, subcuticular  stitches.  Steri-Strips were placed over the 3 skin lesions, as noted  above.   Attention was turned towards removal of the posterolateral arm skin  lesion.  A longitudinal elliptical incision was made around the mass,  which was probably about 5 x 5 mm in size.  The skin was reapproximated  using buried 4-0 Monocryl subcuticular stitches, and Steri-Strips were  placed as well.  Pressure was held for about 5 minutes with good  hemostasis.  A gentle Kerlix wrap was placed around the left arm and an  Ace wrap was placed on top of the Kerlix for good hemostasis.  The  patient tolerated the procedure well.   I will have the patient follow up in about 2 weeks for followup on  pathology and wound check.  I have asked that pathology be sent to Dr.  Murray Hodgkins, as well, who is his physician.      Ardeth Sportsman, MD  Electronically  Signed     SCG/MEDQ  D:  05/02/2006  T:  05/02/2006  Job:  [id number]   cc:   Lenon Curt. Chilton Si, M.D.

## 2010-08-13 NOTE — Op Note (Signed)
NAME:  Dustin Davila, Dustin Davila                      ACCOUNT NO.:  [id number]   MEDICAL RECORD NO.:  [id number]                   PATIENT TYPE:  INP   LOCATION:  2910                                 FACILITY:  [facility]   PHYSICIAN:  Lina Sar, M.D. [facility]               DATE OF BIRTH:  1926-10-17   DATE OF PROCEDURE:  02/04/2003  DATE OF DISCHARGE:                                 OPERATIVE REPORT   PROCEDURE:  Upper endoscopy.   ENDOSCOPIST:  Hedwig Morton. Juanda Chance, M.D. [facility]   INDICATIONS:  This is a 77 year old gentleman we have been seeing because of  melanotic stools and drop in hemoglobin.  Stool was Hemoccult positive.  He  has coronary artery disease and was scheduled for coronary artery bypass  graft today but because of the melena, the procedure was postponed and he is  undergoing upper endoscopy.  Patient underwent upper endoscopy on January 24, 2003, for diagnosis of achalasia with Botox injection and was found to  have a polypoid lesion in the second portion of the duodenum measuring about  20 mm.  Biopsies only showed a glandular atypia but no malignancy.  He is  now undergoing repeat endoscopy to assess the site of the bleeding.   ENDOSCOPE:  Fujinon single-chamber video endoscope.   SEDATION:  1. Versed 5 mg IV.  2. Fentanyl 25 mcg IV.   DESCRIPTION OF PROCEDURE:  The Fujinon single-channel video endoscope was  passed under direct vision through the posterior pharynx and into the  esophagus.  The patient was monitored by pulse oximetry.  His oxygen  saturations were normal.  He was cooperative.  Proximal and mid distal  esophagus showed somewhat dilated lumen with forcible contraction.  The area  over the lower esophageal sphincter was closed and endoscope had to exert  slight pressure to open the LES to enter into the stomach.  There was no  evidence of stricture.  There was no evidence of esophagitis.  Area of the  previous injection of Botox was completely obliterated and  showed normal  tissue.   STOMACH:  The stomach was insufflated with air and showed a small amount of  coffee ground material in the stomach, which was easily washed off from the  surface mucosa.  There were no significant lesions in the body or gastric  antrum.  Pyloric outlet was normal.   DUODENUM:  The duodenum, duodenal bulb itself appeared normal.  But from the  descending duodenum behind the curve into the second portion of duodenum was  a soft polypoid lesion which could not be seen entirely because of the  angulation of the duodenal outlet but it measured at least 10-20 mm.  It was  soft, freely mobile and papillomatous.  It bled on touch with the scope as  the scope passed by it.  There was no obstruction.  The lesion was arising  from the vicinity of the papilla  but the papilla itself was not visualized.  Video photographs were obtained but only part of the lesion was seen because  it was partially obscured by the duodenal C-loop.  There was a moderate  amount of coffee ground material and some blood around this polyp.  The  endoscope was then retracted and the stomach decompressed.   The patient tolerated the procedure well.   IMPRESSION:  1. Soft polypoid mass distal to the duodenal outlet, measuring about 20 mm,     possibly arising from the biliary papilla, with stigmata of bleeding.  2. Achalasia.   PLAN:  We need to further define the nature of the lesion which could be  partially rising from the biliary papilla and this could be a benign or  possibly malignant tumor before proceeding with coronary bypass.  We will  obtain a MRCP to look at the common bile duct to rule out ampullary cancer.  Will check amylase, lipase, liver function tests, CA 19-9 tumor markers as  well as CEA levels.                                               Lina Sar, M.D. [facility]    DB/MEDQ  D:  02/04/2003  T:  02/04/2003  Job:  [id number]   cc:   Madaline Savage, M.D.  [address]  [address]  [place]  [place] [address]  Fax: [phone]

## 2010-12-28 LAB — DIFFERENTIAL
Basophils Absolute: 0.1
Basophils Relative: 1
Eosinophils Absolute: 0.1
Eosinophils Relative: 1
Lymphocytes Relative: 28
Lymphs Abs: 3
Monocytes Absolute: 0.7
Monocytes Relative: 6
Neutro Abs: 7.1
Neutrophils Relative %: 64

## 2010-12-28 LAB — CBC
HCT: 46.9
Hemoglobin: 15.7
MCHC: 33.4
MCV: 91.9
Platelets: 218
RBC: 5.1
RDW: 13.9
WBC: 11 — ABNORMAL HIGH

## 2010-12-28 LAB — HEPATIC FUNCTION PANEL
ALT: 37
AST: 30
Albumin: 4.2
Alkaline Phosphatase: 106
Bilirubin, Direct: 0.2
Indirect Bilirubin: 0.7
Total Bilirubin: 0.9
Total Protein: 7.3

## 2010-12-28 LAB — POCT I-STAT, CHEM 8
BUN: 25 — ABNORMAL HIGH
Calcium, Ion: 1.16
Chloride: 107
Creatinine, Ser: 1.3
Glucose, Bld: 128 — ABNORMAL HIGH
HCT: 48
Hemoglobin: 16.3
Potassium: 4.4
Sodium: 141
TCO2: 26

## 2010-12-28 LAB — URINALYSIS, ROUTINE W REFLEX MICROSCOPIC
Bilirubin Urine: NEGATIVE
Glucose, UA: >1000 — AB
Hgb urine dipstick: NEGATIVE
Ketones, ur: NEGATIVE
Leukocytes, UA: NEGATIVE
Nitrite: NEGATIVE
Protein, ur: NEGATIVE
Specific Gravity, Urine: 1.027
Urobilinogen, UA: 0.2
pH: 5.5

## 2010-12-28 LAB — URINE MICROSCOPIC-ADD ON

## 2011-06-20 ENCOUNTER — Encounter: Payer: Self-pay | Admitting: Gastroenterology

## 2012-06-20 ENCOUNTER — Other Ambulatory Visit: Payer: Self-pay | Admitting: Geriatric Medicine

## 2012-06-20 MED ORDER — COLCHICINE 0.6 MG PO TABS: ORAL_TABLET | ORAL | Status: DC

## 2012-07-09 ENCOUNTER — Other Ambulatory Visit: Payer: Self-pay | Admitting: *Deleted

## 2012-07-09 ENCOUNTER — Other Ambulatory Visit: Payer: Medicare Other

## 2012-07-09 DIAGNOSIS — I10 Essential (primary) hypertension: Secondary | ICD-10-CM

## 2012-07-09 DIAGNOSIS — R350 Frequency of micturition: Secondary | ICD-10-CM

## 2012-07-09 DIAGNOSIS — E785 Hyperlipidemia, unspecified: Secondary | ICD-10-CM

## 2012-07-09 DIAGNOSIS — E1169 Type 2 diabetes mellitus with other specified complication: Secondary | ICD-10-CM

## 2012-07-10 ENCOUNTER — Encounter: Payer: Self-pay | Admitting: Internal Medicine

## 2012-07-10 ENCOUNTER — Ambulatory Visit (INDEPENDENT_AMBULATORY_CARE_PROVIDER_SITE_OTHER): Payer: Medicare Other | Admitting: Internal Medicine

## 2012-07-10 VITALS — BP 126/78 | HR 72 | Temp 95.0°F | Resp 18 | Ht 71.0 in | Wt 181.8 lb

## 2012-07-10 DIAGNOSIS — F0280 Dementia in other diseases classified elsewhere without behavioral disturbance: Secondary | ICD-10-CM | POA: Insufficient documentation

## 2012-07-10 DIAGNOSIS — K279 Peptic ulcer, site unspecified, unspecified as acute or chronic, without hemorrhage or perforation: Secondary | ICD-10-CM | POA: Insufficient documentation

## 2012-07-10 DIAGNOSIS — I4460 Unspecified fascicular block: Secondary | ICD-10-CM | POA: Insufficient documentation

## 2012-07-10 DIAGNOSIS — R5381 Other malaise: Secondary | ICD-10-CM | POA: Insufficient documentation

## 2012-07-10 DIAGNOSIS — G609 Hereditary and idiopathic neuropathy, unspecified: Secondary | ICD-10-CM

## 2012-07-10 DIAGNOSIS — M545 Low back pain: Secondary | ICD-10-CM | POA: Insufficient documentation

## 2012-07-10 DIAGNOSIS — R209 Unspecified disturbances of skin sensation: Secondary | ICD-10-CM | POA: Insufficient documentation

## 2012-07-10 DIAGNOSIS — E1169 Type 2 diabetes mellitus with other specified complication: Secondary | ICD-10-CM

## 2012-07-10 DIAGNOSIS — G4700 Insomnia, unspecified: Secondary | ICD-10-CM | POA: Insufficient documentation

## 2012-07-10 DIAGNOSIS — L821 Other seborrheic keratosis: Secondary | ICD-10-CM | POA: Insufficient documentation

## 2012-07-10 DIAGNOSIS — R300 Dysuria: Secondary | ICD-10-CM | POA: Insufficient documentation

## 2012-07-10 DIAGNOSIS — R5383 Other fatigue: Secondary | ICD-10-CM | POA: Insufficient documentation

## 2012-07-10 DIAGNOSIS — R42 Dizziness and giddiness: Secondary | ICD-10-CM | POA: Insufficient documentation

## 2012-07-10 DIAGNOSIS — G5600 Carpal tunnel syndrome, unspecified upper limb: Secondary | ICD-10-CM | POA: Insufficient documentation

## 2012-07-10 DIAGNOSIS — R413 Other amnesia: Secondary | ICD-10-CM

## 2012-07-10 DIAGNOSIS — K3184 Gastroparesis: Secondary | ICD-10-CM | POA: Insufficient documentation

## 2012-07-10 DIAGNOSIS — J309 Allergic rhinitis, unspecified: Secondary | ICD-10-CM | POA: Insufficient documentation

## 2012-07-10 DIAGNOSIS — K921 Melena: Secondary | ICD-10-CM | POA: Insufficient documentation

## 2012-07-10 DIAGNOSIS — Z9181 History of falling: Secondary | ICD-10-CM

## 2012-07-10 DIAGNOSIS — G5630 Lesion of radial nerve, unspecified upper limb: Secondary | ICD-10-CM

## 2012-07-10 DIAGNOSIS — R55 Syncope and collapse: Secondary | ICD-10-CM | POA: Insufficient documentation

## 2012-07-10 DIAGNOSIS — R0609 Other forms of dyspnea: Secondary | ICD-10-CM | POA: Insufficient documentation

## 2012-07-10 DIAGNOSIS — H269 Unspecified cataract: Secondary | ICD-10-CM | POA: Insufficient documentation

## 2012-07-10 DIAGNOSIS — H60509 Unspecified acute noninfective otitis externa, unspecified ear: Secondary | ICD-10-CM | POA: Insufficient documentation

## 2012-07-10 DIAGNOSIS — G5631 Lesion of radial nerve, right upper limb: Secondary | ICD-10-CM

## 2012-07-10 DIAGNOSIS — K220 Achalasia of cardia: Secondary | ICD-10-CM | POA: Insufficient documentation

## 2012-07-10 DIAGNOSIS — I447 Left bundle-branch block, unspecified: Secondary | ICD-10-CM | POA: Insufficient documentation

## 2012-07-10 DIAGNOSIS — M25519 Pain in unspecified shoulder: Secondary | ICD-10-CM | POA: Insufficient documentation

## 2012-07-10 DIAGNOSIS — G3184 Mild cognitive impairment, so stated: Secondary | ICD-10-CM | POA: Insufficient documentation

## 2012-07-10 DIAGNOSIS — N138 Other obstructive and reflux uropathy: Secondary | ICD-10-CM | POA: Insufficient documentation

## 2012-07-10 DIAGNOSIS — N401 Enlarged prostate with lower urinary tract symptoms: Secondary | ICD-10-CM | POA: Insufficient documentation

## 2012-07-10 DIAGNOSIS — K5900 Constipation, unspecified: Secondary | ICD-10-CM | POA: Insufficient documentation

## 2012-07-10 DIAGNOSIS — R0989 Other specified symptoms and signs involving the circulatory and respiratory systems: Secondary | ICD-10-CM | POA: Insufficient documentation

## 2012-07-10 DIAGNOSIS — L299 Pruritus, unspecified: Secondary | ICD-10-CM | POA: Insufficient documentation

## 2012-07-10 DIAGNOSIS — L723 Sebaceous cyst: Secondary | ICD-10-CM | POA: Insufficient documentation

## 2012-07-10 DIAGNOSIS — H3530 Unspecified macular degeneration: Secondary | ICD-10-CM | POA: Insufficient documentation

## 2012-07-10 DIAGNOSIS — D1739 Benign lipomatous neoplasm of skin and subcutaneous tissue of other sites: Secondary | ICD-10-CM | POA: Insufficient documentation

## 2012-07-10 DIAGNOSIS — K589 Irritable bowel syndrome without diarrhea: Secondary | ICD-10-CM | POA: Insufficient documentation

## 2012-07-10 DIAGNOSIS — R350 Frequency of micturition: Secondary | ICD-10-CM | POA: Insufficient documentation

## 2012-07-10 DIAGNOSIS — R972 Elevated prostate specific antigen [PSA]: Secondary | ICD-10-CM | POA: Insufficient documentation

## 2012-07-10 DIAGNOSIS — M109 Gout, unspecified: Secondary | ICD-10-CM | POA: Insufficient documentation

## 2012-07-10 DIAGNOSIS — M7750 Other enthesopathy of unspecified foot: Secondary | ICD-10-CM

## 2012-07-10 DIAGNOSIS — D3701 Neoplasm of uncertain behavior of lip: Secondary | ICD-10-CM | POA: Insufficient documentation

## 2012-07-10 DIAGNOSIS — M7740 Metatarsalgia, unspecified foot: Secondary | ICD-10-CM

## 2012-07-10 DIAGNOSIS — M2560 Stiffness of unspecified joint, not elsewhere classified: Secondary | ICD-10-CM | POA: Insufficient documentation

## 2012-07-10 DIAGNOSIS — K5732 Diverticulitis of large intestine without perforation or abscess without bleeding: Secondary | ICD-10-CM | POA: Insufficient documentation

## 2012-07-10 DIAGNOSIS — F329 Major depressive disorder, single episode, unspecified: Secondary | ICD-10-CM | POA: Insufficient documentation

## 2012-07-10 LAB — COMPREHENSIVE METABOLIC PANEL
Albumin/Globulin Ratio: 1.5 (ref 1.1–2.5)
Albumin: 4 g/dL (ref 3.5–4.7)
Alkaline Phosphatase: 115 IU/L (ref 39–117)
BUN/Creatinine Ratio: 17 (ref 10–22)
BUN: 15 mg/dL (ref 8–27)
Chloride: 101 mmol/L (ref 97–108)
GFR calc Af Amer: 91 mL/min/{1.73_m2} (ref >59)
GFR calc non Af Amer: 79 mL/min/{1.73_m2} (ref >59)
Potassium: 4.1 mmol/L (ref 3.5–5.2)
Total Bilirubin: 0.5 mg/dL (ref 0.0–1.2)
Total Protein: 6.7 g/dL (ref 6.0–8.5)

## 2012-07-10 LAB — LIPID PANEL
Cholesterol, Total: 113 mg/dL (ref 100–199)
LDL Calculated: 40 mg/dL (ref 0–99)
VLDL Cholesterol Cal: 22 mg/dL (ref 5–40)

## 2012-07-10 LAB — MICROALBUMIN / CREATININE URINE RATIO
Creatinine, Ur: 195.9 mg/dL (ref 22.0–328.0)
MICROALB/CREAT RATIO: 13 mg/g creat (ref 0.0–30.0)
Microalbumin, Urine: 25.4 ug/mL — ABNORMAL HIGH (ref 0.0–17.0)

## 2012-07-10 LAB — URINALYSIS
Bilirubin, UA: NEGATIVE
Ketones, UA: NEGATIVE
Protein, UA: NEGATIVE
RBC, UA: NEGATIVE
Urobilinogen, Ur: 0.2 mg/dL (ref 0.0–1.9)

## 2012-07-10 MED ORDER — PENTAZOCINE-NALOXONE 50-0.5 MG PO TABS: 1.0000 | ORAL_TABLET | ORAL | Status: DC | PRN

## 2012-07-10 NOTE — Patient Instructions (Signed)
Do not use Ibuprofen or naproxen.

## 2012-08-05 DIAGNOSIS — R5381 Other malaise: Secondary | ICD-10-CM | POA: Insufficient documentation

## 2012-08-05 DIAGNOSIS — G5630 Lesion of radial nerve, unspecified upper limb: Secondary | ICD-10-CM | POA: Insufficient documentation

## 2012-08-05 DIAGNOSIS — M7750 Other enthesopathy of unspecified foot: Secondary | ICD-10-CM | POA: Insufficient documentation

## 2012-08-05 DIAGNOSIS — Z9181 History of falling: Secondary | ICD-10-CM | POA: Insufficient documentation

## 2012-08-05 NOTE — Progress Notes (Signed)
Subjective:    Patient ID: Dustin Davila, male    DOB: Aug 30, 1926, 79 y.o.   MRN: [id number]  HPI Patient was seen in [facility] about 6 weeks ago in the emergency room. He fell Backwards and went down on the floor. After 2 hours he got up. The next day he was feeling poorly and was admitted to [facility]. He says he was there 10 days. Following Hospitalization he went to [facility] and stayed another 8 days. He says his strength is still low. He feels off balance.  Patient says his right thumb does not sensation and seems weak. He is dropping things from his right ear because of the weakness of the thumb. He has no history of injury. The sensations extend onto his forefinger.  Patient is using a walker now due to feeling insecure.  Patient has pain in the right foot. He had been put on colchicine, ibuprofen, and naproxen. None of these seem to be helpful. Celebrex was tried and also was not helpful.  Diabetes has been under reasonably good control.   Review of Systems  Constitutional: Positive for activity change and fatigue. Negative for fever, chills, diaphoresis and appetite change.  HENT: Positive for hearing loss and tinnitus. Negative for ear pain.   Eyes: Negative.   Respiratory: Negative for cough, choking, chest tightness and shortness of breath.   Cardiovascular: Negative for chest pain, palpitations and leg swelling.  Gastrointestinal: Negative for nausea, abdominal pain, diarrhea, blood in stool and abdominal distention.  Endocrine: Negative for cold intolerance, heat intolerance, polydipsia, polyphagia and polyuria.       Diabetic  Genitourinary: Positive for urgency and frequency.  Musculoskeletal: Positive for myalgias, back pain, arthralgias and gait problem.  Skin: Negative.   Neurological: Positive for weakness and numbness. Negative for dizziness, tremors, seizures, syncope, facial asymmetry, speech  difficulty, light-headedness and headaches.       Complaints of paresthesias right thumb and forefinger.  Hematological: Negative.   Psychiatric/Behavioral: Positive for confusion, sleep disturbance and dysphoric mood. Negative for behavioral problems.       Objective:   Physical Exam  Constitutional: He is oriented to person, place, and time.  Frail elderly male  HENT:  Head: Normocephalic and atraumatic.  Partial hearing loss bilaterally  Eyes: Conjunctivae and EOM are normal. Pupils are equal, round, and reactive to light.  Neck: Normal range of motion. Neck supple. No JVD present. No tracheal deviation present. No thyromegaly present.  Cardiovascular: Normal rate, regular rhythm, normal heart sounds and intact distal pulses.  Exam reveals no gallop and no friction rub.   No murmur heard. Pulmonary/Chest: Effort normal and breath sounds normal. No respiratory distress. He has no wheezes. He has no rales. He exhibits no tenderness.  Abdominal: Bowel sounds are normal. He exhibits no distension and no mass. There is no tenderness.  Musculoskeletal: Normal range of motion. He exhibits no edema and no tenderness.  Lymphadenopathy:    He has no cervical adenopathy.  Neurological: He is alert and oriented to person, place, and time. No cranial nerve deficit. Coordination normal.  Skin: Skin is warm and dry. No rash noted. No erythema. No pallor.  Psychiatric: He has a normal mood and affect. His behavior is normal. Thought content normal.    Appointment on 07/09/2012  Component Date Value Range Status  . Hemoglobin A1C 07/09/2012 5.9* 4.8 - 5.6 % Final   Comment:  Increased risk for diabetes: 5.7 - 6.4                                   Diabetes: >6.4                                   Glycemic control for adults with diabetes: <7.0  . Estimated average glucose 07/09/2012 123   Final  . Glucose 07/09/2012 209* 65 - 99 mg/dL Final  . BUN 40/98/1191 15  8 - 27 mg/dL Final  .  Creatinine, Ser 07/09/2012 0.88  0.76 - 1.27 mg/dL Final  . GFR calc non Af Amer 07/09/2012 79  >59 mL/min/1.73 Final  . GFR calc Af Amer 07/09/2012 91  >59 mL/min/1.73 Final  . BUN/Creatinine Ratio 07/09/2012 17  10 - 22 Final  . Sodium 07/09/2012 140  134 - 144 mmol/L Final  . Potassium 07/09/2012 4.1  3.5 - 5.2 mmol/L Final  . Chloride 07/09/2012 101  97 - 108 mmol/L Final  . CO2 07/09/2012 25  19 - 28 mmol/L Final  . Calcium 07/09/2012 8.9  8.6 - 10.2 mg/dL Final  . Total Protein 07/09/2012 6.7  6.0 - 8.5 g/dL Final  . Albumin 47/82/9562 4.0  3.5 - 4.7 g/dL Final  . Globulin, Total 07/09/2012 2.7  1.5 - 4.5 g/dL Final  . Albumin/Globulin Ratio 07/09/2012 1.5  1.1 - 2.5 Final  . Total Bilirubin 07/09/2012 0.5  0.0 - 1.2 mg/dL Final  . Alkaline Phosphatase 07/09/2012 115  39 - 117 IU/L Final  . AST 07/09/2012 22  0 - 40 IU/L Final  . ALT 07/09/2012 19  0 - 44 IU/L Final  . Cholesterol, Total 07/09/2012 113  100 - 199 mg/dL Final  . Triglycerides 07/09/2012 108  0 - 149 mg/dL Final  . HDL 13/10/6576 51  >39 mg/dL Final   Comment: According to ATP-III Guidelines, HDL-C >59 mg/dL is considered a                          negative risk factor for CHD.  Marland Kitchen VLDL Cholesterol Cal 07/09/2012 22  5 - 40 mg/dL Final  . LDL Calculated 07/09/2012 40  0 - 99 mg/dL Final  . Chol/HDL Ratio 07/09/2012 2.2  0.0 - 5.0 ratio units Final  . Creatinine, Ur 07/09/2012 195.9  22.0 - 328.0 mg/dL Final  . Microalbum.,U,Random 07/09/2012 25.4* 0.0 - 17.0 ug/mL Final  . MICROALB/CREAT RATIO 07/09/2012 13.0  0.0 - 30.0 mg/g creat Final  . Specific Gravity, UA 07/09/2012 1.025  1.005 - 1.030 Final  . pH, UA 07/09/2012 6.0  5.0 - 7.5 Final  . Color, UA 07/09/2012 Yellow  Yellow Final  . Appearance Ur 07/09/2012 Clear  Clear Final  . Leukocytes, UA 07/09/2012 Negative  Negative Final  . Protein, UA 07/09/2012 Negative  Negative/Trace Final  . Glucose, UA 07/09/2012 Trace* Negative Final  . Ketones, UA 07/09/2012  Negative  Negative Final  . RBC, UA 07/09/2012 Negative  Negative Final  . Bilirubin, UA 07/09/2012 Negative  Negative Final  . Urobilinogen, Ur 07/09/2012 0.2  0.0 - 1.9 mg/dL Final  . Nitrite, UA 46/96/2952 Negative  Negative Final         Assessment & Plan:  1. Type II or unspecified type diabetes mellitus with other specified manifestations, not  stated as uncontrolled Currently under good control  2. Memory loss Chronic. Slowly progressive. He appears to me to have a setback with his recent illness.  3. Mild cognitive impairment, so stated Cognitive impairment goes along with memory loss. It is possible this is a slowly progressive Alzheimer's disease.  4. Unspecified hereditary and idiopathic peripheral neuropathy Chronic peripheral neuropathy related to diabetes  5. Metatarsalgia, unspecified laterality Subacute pains in the metatarsal tarsal area particularly of the right foot. Patient says this is doing better as compared to February.  6. Personal history of fall Unstable gait. Patient is not strong at the present time. He is continuing to get some physical therapy. He is using the walker.  7. Debility, unspecified Physical therapy will be helpful. Patient may be closing in on a time that an assisted-living facility would be in his best interest  8. Enthesopathy of ankle and tarsus, unspecified Metatarsalgia as above.  9. Radial nerve dysfunction, right Cranial nerve dysfunction. It is not clear if this is related to his diabetes and peripheral neuralgia of the feet. I suspect that he has I nerve compression problem. He does not want any further workup at present time.

## 2012-08-28 ENCOUNTER — Encounter: Payer: Self-pay | Admitting: *Deleted

## 2012-08-28 ENCOUNTER — Ambulatory Visit (INDEPENDENT_AMBULATORY_CARE_PROVIDER_SITE_OTHER): Payer: Medicare Other | Admitting: Internal Medicine

## 2012-08-28 ENCOUNTER — Encounter: Payer: Self-pay | Admitting: Internal Medicine

## 2012-08-28 VITALS — BP 124/68 | HR 70 | Temp 97.9°F | Resp 22 | Ht 70.5 in | Wt 182.8 lb

## 2012-08-28 DIAGNOSIS — E785 Hyperlipidemia, unspecified: Secondary | ICD-10-CM

## 2012-08-28 DIAGNOSIS — Z950 Presence of cardiac pacemaker: Secondary | ICD-10-CM | POA: Insufficient documentation

## 2012-08-28 DIAGNOSIS — F329 Major depressive disorder, single episode, unspecified: Secondary | ICD-10-CM

## 2012-08-28 DIAGNOSIS — R5381 Other malaise: Secondary | ICD-10-CM

## 2012-08-28 DIAGNOSIS — I10 Essential (primary) hypertension: Secondary | ICD-10-CM

## 2012-08-28 DIAGNOSIS — R5383 Other fatigue: Secondary | ICD-10-CM

## 2012-08-28 DIAGNOSIS — M109 Gout, unspecified: Secondary | ICD-10-CM

## 2012-08-28 MED ORDER — VORTIOXETINE HBR 10 MG PO TABS: 10.0000 mg | ORAL_TABLET | Freq: Every day | ORAL | Status: DC

## 2012-08-28 NOTE — Progress Notes (Signed)
Subjective:    Patient ID: Dustin Davila, male    DOB: [birth year], 79 y.o.   MRN: [id number]  HPI May have had reaction to pentazocine: confusion.  Exhausted Depressed  Cardiac pacemaker in situ -Needs a new cardiologist. His prior cardio in [place], Dr. Luberta Robertson, is retiring.  Depressive disorder, not elsewhere classified : Feeling said and tired most of the time  HYPERLIPIDEMIA: Controlled  HYPERTENSION: Controlled  Gout, unspecified: Continues to have pain in his feet. It is not clear this is related to gout. He would like to try taking the colchicine again  Other malaise and fatigue: Etiology uncertain. Suspect this is related to his depression    Current Outpatient Prescriptions on File Prior to Visit  Medication Sig Dispense Refill  . finasteride (PROSCAR) 5 MG tablet Take 5 mg by mouth daily. Tke one tablet once a day to help with prostate      . metformin (FORTAMET) 500 MG (OSM) 24 hr tablet Take 500 mg by mouth daily with breakfast. Take one tablet once a day to control diabetes      . Misc Natural Products (TART CHERRY ADVANCED) CAPS Take by mouth. Take one tablet twice a day to help with gout      . pentazocine-naloxone (TALWIN NX) 50-0.5 MG per tablet Take 1 tablet by mouth every 4 (four) hours as needed for pain.  30 tablet  0  . simvastatin (ZOCOR) 40 MG tablet Take 40 mg by mouth every evening. Take one tablet once a day for cholesterol      . tamsulosin (FLOMAX) 0.4 MG CAPS Take by mouth. Take one capsule once a day for prostate      . zolpidem (AMBIEN) 10 MG tablet Take 10 mg by mouth at bedtime as needed for sleep. Take one tablet at bedtime for sleep       No current facility-administered medications on file prior to visit.    Review of Systems  Constitutional: Positive for activity change, appetite change and fatigue.  HENT: Positive for hearing loss.   Eyes: Negative.   Respiratory: Positive for shortness of breath.   Cardiovascular: Positive for  leg swelling. Negative for chest pain and palpitations.       Pacemaker  Gastrointestinal:       Prior history includes intermittent diarrhea, intermittent constipation, heartburn and reflux, and occasional nausea. He has had some difficulty with swallowing in the past. Appetite is poor. He frequently feels full for finishing a meal.  Endocrine: Negative.   Genitourinary:       Delays in starting urination. Hesitancy. Nocturia x2.  Musculoskeletal:       Arthritis. Shoulder pain and neck pains.  Skin: Negative.   Neurological:       Numb, especially in the right hand. Loss of sensation in the feet. Unsteady. Memory loss. History of confusion.  Hematological: Negative.   Psychiatric/Behavioral:       Insomnia. fatigued. Feeling sad most of the time. Worries about his demented wife who is in a nursing facility.       Objective: BP 124/68  Pulse 70  Temp(Src) 97.9 F (36.6 C)  Resp 22  Ht 5' 10.5" (1.791 m)  Wt 182 lb 12.8 oz (82.918 kg)  BMI 25.85 kg/m2    Physical Exam  Constitutional: He is oriented to person, place, and time. He appears well-nourished. He appears distressed.  HENT:  Bilateral partial hearing deficits.  Eyes:  Corrective lens.  Neck: Normal range of motion. Neck  supple. No JVD present. No tracheal deviation present. No thyromegaly present.  Cardiovascular: Normal rate, regular rhythm and normal heart sounds.  Exam reveals no gallop and no friction rub.   No murmur heard. Pacemaker left upper chest wall.  Pulmonary/Chest: Effort normal and breath sounds normal. No respiratory distress. He has no wheezes. He has no rales.  Abdominal: Soft. Bowel sounds are normal. He exhibits no distension and no mass. There is no tenderness.  Musculoskeletal: Normal range of motion. He exhibits edema. He exhibits no tenderness.  Lymphadenopathy:    He has no cervical adenopathy.  Neurological: He is alert and oriented to person, place, and time. No cranial nerve deficit.  Coordination normal.  Skin: Skin is warm and dry. No rash noted. No erythema. No pallor.  Psychiatric:  Sad. Speaks slowly at times.     Lab reports Appointment on 07/09/2012  Component Date Value Range Status  . Hemoglobin A1C 07/09/2012 5.9* 4.8 - 5.6 % Final   Comment:          Increased risk for diabetes: 5.7 - 6.4                                   Diabetes: >6.4                                   Glycemic control for adults with diabetes: <7.0  . Estimated average glucose 07/09/2012 123   Final  . Glucose 07/09/2012 209* 65 - 99 mg/dL Final  . BUN 16/12/9602 15  8 - 27 mg/dL Final  . Creatinine, Ser 07/09/2012 0.88  0.76 - 1.27 mg/dL Final  . GFR calc non Af Amer 07/09/2012 79  >59 mL/min/1.73 Final  . GFR calc Af Amer 07/09/2012 91  >59 mL/min/1.73 Final  . BUN/Creatinine Ratio 07/09/2012 17  10 - 22 Final  . Sodium 07/09/2012 140  134 - 144 mmol/L Final  . Potassium 07/09/2012 4.1  3.5 - 5.2 mmol/L Final  . Chloride 07/09/2012 101  97 - 108 mmol/L Final  . CO2 07/09/2012 25  19 - 28 mmol/L Final  . Calcium 07/09/2012 8.9  8.6 - 10.2 mg/dL Final  . Total Protein 07/09/2012 6.7  6.0 - 8.5 g/dL Final  . Albumin 54/11/8117 4.0  3.5 - 4.7 g/dL Final  . Globulin, Total 07/09/2012 2.7  1.5 - 4.5 g/dL Final  . Albumin/Globulin Ratio 07/09/2012 1.5  1.1 - 2.5 Final  . Total Bilirubin 07/09/2012 0.5  0.0 - 1.2 mg/dL Final  . Alkaline Phosphatase 07/09/2012 115  39 - 117 IU/L Final  . AST 07/09/2012 22  0 - 40 IU/L Final  . ALT 07/09/2012 19  0 - 44 IU/L Final  . Cholesterol, Total 07/09/2012 113  100 - 199 mg/dL Final  . Triglycerides 07/09/2012 108  0 - 149 mg/dL Final  . HDL 14/78/2956 51  >39 mg/dL Final   Comment: According to ATP-III Guidelines, HDL-C >59 mg/dL is considered a                          negative risk factor for CHD.  Marland Kitchen VLDL Cholesterol Cal 07/09/2012 22  5 - 40 mg/dL Final  . LDL Calculated 07/09/2012 40  0 - 99 mg/dL Final  . Chol/HDL Ratio 07/09/2012 2.2   0.0 - 5.0  ratio units Final  . Creatinine, Ur 07/09/2012 195.9  22.0 - 328.0 mg/dL Final  . Microalbum.,U,Random 07/09/2012 25.4* 0.0 - 17.0 ug/mL Final  . MICROALB/CREAT RATIO 07/09/2012 13.0  0.0 - 30.0 mg/g creat Final  . Specific Gravity, UA 07/09/2012 1.025  1.005 - 1.030 Final  . pH, UA 07/09/2012 6.0  5.0 - 7.5 Final  . Color, UA 07/09/2012 Yellow  Yellow Final  . Appearance Ur 07/09/2012 Clear  Clear Final  . Leukocytes, UA 07/09/2012 Negative  Negative Final  . Protein, UA 07/09/2012 Negative  Negative/Trace Final  . Glucose, UA 07/09/2012 Trace* Negative Final  . Ketones, UA 07/09/2012 Negative  Negative Final  . RBC, UA 07/09/2012 Negative  Negative Final  . Bilirubin, UA 07/09/2012 Negative  Negative Final  . Urobilinogen, Ur 07/09/2012 0.2  0.0 - 1.9 mg/dL Final  . Nitrite, UA 40/98/1191 Negative  Negative Final        Assessment & Plan:  1. Cardiac pacemaker in situ Patient will need continued followup with cardiologist - Ambulatory referral to Cardiology  2. Depressive disorder, not elsewhere classified Patient agreeable to starting an antidepressant - Vortioxetine HBr (BRINTELLIX) 10 MG TABS; Take 10 mg by mouth daily. To help fatigue and depression  Dispense: 30 tablet; Refill: 5  3. HYPERLIPIDEMIA Stable  4. HYPERTENSION Controlled  5. Gout, unspecified Resume colchicine  6. Other malaise and fatigue Etiology is not established. He will be started on an antidepressant and reassess.

## 2012-08-28 NOTE — Patient Instructions (Signed)
Start new medication to help fatigue and depression, Brintellix. Continue Advil for foot pain.

## 2012-09-01 ENCOUNTER — Other Ambulatory Visit: Payer: Self-pay | Admitting: Internal Medicine

## 2012-09-01 MED ORDER — COLCHICINE 0.6 MG PO TABS: ORAL_TABLET | ORAL | Status: DC

## 2012-09-21 ENCOUNTER — Encounter: Payer: Self-pay | Admitting: *Deleted

## 2012-09-26 ENCOUNTER — Encounter: Payer: Self-pay | Admitting: Internal Medicine

## 2012-09-26 ENCOUNTER — Ambulatory Visit (INDEPENDENT_AMBULATORY_CARE_PROVIDER_SITE_OTHER): Payer: Medicare Other | Admitting: Internal Medicine

## 2012-09-26 VITALS — BP 142/80 | HR 82 | Temp 98.2°F | Resp 20 | Ht 70.5 in | Wt 183.0 lb

## 2012-09-26 DIAGNOSIS — F329 Major depressive disorder, single episode, unspecified: Secondary | ICD-10-CM

## 2012-09-26 DIAGNOSIS — E1169 Type 2 diabetes mellitus with other specified complication: Secondary | ICD-10-CM

## 2012-09-26 DIAGNOSIS — G5630 Lesion of radial nerve, unspecified upper limb: Secondary | ICD-10-CM

## 2012-09-26 DIAGNOSIS — I10 Essential (primary) hypertension: Secondary | ICD-10-CM

## 2012-09-26 DIAGNOSIS — M109 Gout, unspecified: Secondary | ICD-10-CM

## 2012-09-26 DIAGNOSIS — I2510 Atherosclerotic heart disease of native coronary artery without angina pectoris: Secondary | ICD-10-CM

## 2012-09-26 DIAGNOSIS — R5383 Other fatigue: Secondary | ICD-10-CM

## 2012-09-26 DIAGNOSIS — R5381 Other malaise: Secondary | ICD-10-CM

## 2012-09-26 DIAGNOSIS — G5631 Lesion of radial nerve, right upper limb: Secondary | ICD-10-CM

## 2012-09-26 MED ORDER — VORTIOXETINE HBR 10 MG PO TABS: 10.0000 mg | ORAL_TABLET | Freq: Every day | ORAL | Status: DC

## 2012-09-26 NOTE — Progress Notes (Signed)
Subjective:    Patient ID: Dustin Davila, male    DOB: 1926/03/30, 79 y.o.   MRN: [id number]  HPI Larey Seat in bathtub when he stumbled over the edge of the tub.  Depression is  Little better.  Pain (gout) iin the right foot is better  Current Outpatient Prescriptions on File Prior to Visit  Medication Sig Dispense Refill  . aspirin 81 MG tablet Take 81 mg by mouth daily. Take 1 tablet daily to prevent heart attack, stroke and blood clot.      . carvedilol (COREG) 3.125 MG tablet Take 3.125 mg by mouth 2 (two) times daily with a meal. Take 1 tablet by mouth 2 times daily for heart.      . colchicine 0.6 MG tablet Take one tablet twice daily to help the pain in the feet  60 tablet  5  . finasteride (PROSCAR) 5 MG tablet Take 5 mg by mouth daily. Tke one tablet once a day to help with prostate      . glucose blood (FREESTYLE LITE) test strip 1 each by Other route as needed for other. Use as instructed, test blood sugar twice daily.      . Lancets (FREESTYLE) lancets 1 each by Other route as needed for other. Use as instructed      . metformin (FORTAMET) 500 MG (OSM) 24 hr tablet Take 500 mg by mouth daily with breakfast. Take one tablet once a day to control diabetes      . polyethylene glycol (MIRALAX / GLYCOLAX) packet Take 17 g by mouth daily. Take 17 grams in 8 oz of water daily as needed for constipation.      . simvastatin (ZOCOR) 40 MG tablet Take 40 mg by mouth every evening. Take one tablet once a day for cholesterol       No current facility-administered medications on file prior to visit.    Review of Systems  Constitutional: Positive for activity change, appetite change and fatigue.  HENT: Positive for hearing loss.   Eyes: Negative.   Respiratory: Positive for shortness of breath.   Cardiovascular: Positive for leg swelling. Negative for chest pain and palpitations.       Pacemaker  Gastrointestinal:       Prior history includes intermittent diarrhea, intermittent  constipation, heartburn and reflux, and occasional nausea. He has had some difficulty with swallowing in the past. Appetite is poor. He frequently feels full befor finishing a meal.  Endocrine: Negative.   Genitourinary:       Delays in starting urination. Hesitancy. Nocturia x2.  Musculoskeletal:       Arthritis. Shoulder pain and neck pains.  Skin: Negative.   Neurological:       Numb, especially in the right hand. Loss of sensation in the feet. Unsteady. Memory loss. History of confusion.  Hematological: Negative.   Psychiatric/Behavioral:       Insomnia. fatigued. Feeling sad most of the time. Worries about his demented wife who is in a nursing facility.       Objective:   Physical Exam  Constitutional: He is oriented to person, place, and time. He appears well-nourished. He appears distressed.  HENT:  Bilateral partial hearing deficits.  Eyes:  Corrective lens.  Neck: Normal range of motion. Neck supple. No JVD present. No tracheal deviation present. No thyromegaly present.  Cardiovascular: Normal rate, regular rhythm and normal heart sounds.  Exam reveals no gallop and no friction rub.   No murmur heard. Pacemaker left upper chest  wall.  Pulmonary/Chest: Effort normal and breath sounds normal. No respiratory distress. He has no wheezes. He has no rales.  Abdominal: Soft. Bowel sounds are normal. He exhibits no distension and no mass. There is no tenderness.  Musculoskeletal: Normal range of motion. He exhibits edema. He exhibits no tenderness.  Lymphadenopathy:    He has no cervical adenopathy.  Neurological: He is alert and oriented to person, place, and time. No cranial nerve deficit. Coordination normal.  Skin: Skin is warm and dry. No rash noted. No erythema. No pallor.  Psychiatric:  Sad. Speaks slowly at times.     Appointment on 07/09/2012  Component Date Value Range Status  . Hemoglobin A1C 07/09/2012 5.9* 4.8 - 5.6 % Final   Comment:          Increased risk for  diabetes: 5.7 - 6.4                                   Diabetes: >6.4                                   Glycemic control for adults with diabetes: <7.0  . Estimated average glucose 07/09/2012 123   Final  . Glucose 07/09/2012 209* 65 - 99 mg/dL Final  . BUN 16/12/9602 15  8 - 27 mg/dL Final  . Creatinine, Ser 07/09/2012 0.88  0.76 - 1.27 mg/dL Final  . GFR calc non Af Amer 07/09/2012 79  >59 mL/min/1.73 Final  . GFR calc Af Amer 07/09/2012 91  >59 mL/min/1.73 Final  . BUN/Creatinine Ratio 07/09/2012 17  10 - 22 Final  . Sodium 07/09/2012 140  134 - 144 mmol/L Final  . Potassium 07/09/2012 4.1  3.5 - 5.2 mmol/L Final  . Chloride 07/09/2012 101  97 - 108 mmol/L Final  . CO2 07/09/2012 25  19 - 28 mmol/L Final  . Calcium 07/09/2012 8.9  8.6 - 10.2 mg/dL Final  . Total Protein 07/09/2012 6.7  6.0 - 8.5 g/dL Final  . Albumin 54/11/8117 4.0  3.5 - 4.7 g/dL Final  . Globulin, Total 07/09/2012 2.7  1.5 - 4.5 g/dL Final  . Albumin/Globulin Ratio 07/09/2012 1.5  1.1 - 2.5 Final  . Total Bilirubin 07/09/2012 0.5  0.0 - 1.2 mg/dL Final  . Alkaline Phosphatase 07/09/2012 115  39 - 117 IU/L Final  . AST 07/09/2012 22  0 - 40 IU/L Final  . ALT 07/09/2012 19  0 - 44 IU/L Final  . Cholesterol, Total 07/09/2012 113  100 - 199 mg/dL Final  . Triglycerides 07/09/2012 108  0 - 149 mg/dL Final  . HDL 14/78/2956 51  >39 mg/dL Final   Comment: According to ATP-III Guidelines, HDL-C >59 mg/dL is considered a                          negative risk factor for CHD.  Marland Kitchen VLDL Cholesterol Cal 07/09/2012 22  5 - 40 mg/dL Final  . LDL Calculated 07/09/2012 40  0 - 99 mg/dL Final  . Chol/HDL Ratio 07/09/2012 2.2  0.0 - 5.0 ratio units Final  . Creatinine, Ur 07/09/2012 195.9  22.0 - 328.0 mg/dL Final  . Microalbum.,U,Random 07/09/2012 25.4* 0.0 - 17.0 ug/mL Final  . MICROALB/CREAT RATIO 07/09/2012 13.0  0.0 - 30.0 mg/g creat Final  . Specific Gravity,  UA 07/09/2012 1.025  1.005 - 1.030 Final  . pH, UA 07/09/2012 6.0   5.0 - 7.5 Final  . Color, UA 07/09/2012 Yellow  Yellow Final  . Appearance Ur 07/09/2012 Clear  Clear Final  . Leukocytes, UA 07/09/2012 Negative  Negative Final  . Protein, UA 07/09/2012 Negative  Negative/Trace Final  . Glucose, UA 07/09/2012 Trace* Negative Final  . Ketones, UA 07/09/2012 Negative  Negative Final  . RBC, UA 07/09/2012 Negative  Negative Final  . Bilirubin, UA 07/09/2012 Negative  Negative Final  . Urobilinogen, Ur 07/09/2012 0.2  0.0 - 1.9 mg/dL Final  . Nitrite, UA 62/13/0865 Negative  Negative Final         Assessment & Plan:  Depressive disorder, not elsewhere classified: contine current medication   - Plan: Vortioxetine HBr (BRINTELLIX) 10 MG TABS  HYPERTENSION: controlled  Gout, unspecified: improved  Type II or unspecified type diabetes mellitus with other specified manifestations, not stated as uncontrolled: stable  Radial nerve dysfunction, right: persistent numbness in the right hand  Other malaise and fatigue: unchanged. Likely related to depression  CORONARY ARTERY DISEASE: unchanged   - Plan: Ambulatory referral to Cardiology

## 2012-09-26 NOTE — Patient Instructions (Addendum)
Continue current medications. 

## 2012-10-17 ENCOUNTER — Encounter: Payer: Self-pay | Admitting: *Deleted

## 2012-10-18 ENCOUNTER — Institutional Professional Consult (permissible substitution): Payer: Self-pay | Admitting: Internal Medicine

## 2012-10-19 ENCOUNTER — Encounter: Payer: Self-pay | Admitting: Internal Medicine

## 2012-10-22 ENCOUNTER — Institutional Professional Consult (permissible substitution): Payer: Self-pay | Admitting: Cardiovascular Disease

## 2012-11-07 ENCOUNTER — Ambulatory Visit: Payer: Medicare Other | Admitting: Internal Medicine

## 2012-11-09 ENCOUNTER — Ambulatory Visit (INDEPENDENT_AMBULATORY_CARE_PROVIDER_SITE_OTHER): Payer: Medicare Other | Admitting: Internal Medicine

## 2012-11-09 ENCOUNTER — Encounter: Payer: Self-pay | Admitting: Internal Medicine

## 2012-11-09 VITALS — BP 132/80 | HR 72 | Ht 72.0 in | Wt 182.0 lb

## 2012-11-09 DIAGNOSIS — I10 Essential (primary) hypertension: Secondary | ICD-10-CM

## 2012-11-09 DIAGNOSIS — I2589 Other forms of chronic ischemic heart disease: Secondary | ICD-10-CM

## 2012-11-09 DIAGNOSIS — I495 Sick sinus syndrome: Secondary | ICD-10-CM

## 2012-11-09 DIAGNOSIS — I447 Left bundle-branch block, unspecified: Secondary | ICD-10-CM

## 2012-11-09 DIAGNOSIS — I2510 Atherosclerotic heart disease of native coronary artery without angina pectoris: Secondary | ICD-10-CM

## 2012-11-09 DIAGNOSIS — I255 Ischemic cardiomyopathy: Secondary | ICD-10-CM

## 2012-11-09 DIAGNOSIS — Z950 Presence of cardiac pacemaker: Secondary | ICD-10-CM

## 2012-11-09 NOTE — Progress Notes (Signed)
ELECTROPHYSIOLOGY CONSULT NOTE  Patient ID: Dustin Davila, MRN: [id number], DOB/AGE: 1926/04/06 79 y.o. Admit date: (Not on file) Date of Consult: 11/09/2012  Primary Physician: Kimber Relic, MD Primary Cardiologist: new  Chief Complaint: establish   HPI Dustin Davila is a 79 y.o. male  With a history of coronary artery disease. He has a history of LAD stenting 1999, stenting of the diagonal 2003.  He then underwent off-pump bypass grafting with LIMA to his LAD and a vein graft to his diagonal. This was done following stenting of his left subclavian artery by Dr. Leisa Lenz.  He has been followed by Dr. Luberta Robertson  ; he is retiring and moving to [place].      In 2006 he developed symptomatic bradycardia and underwent pacemaker implantation Medtronic device; at that time he had normal left ventricular function  He does not have edema. He denies chest pain. He does have dyspnea on exertion. He is able to climb a flight of stairs. He is able to walk 100 yards.  Recent stress Myoview was apparently unrevealing; details are not available  For reasons that are not clear he was on warfarin;  He has not been taking it recently.   Past Medical History  Diagnosis Date  . Allergy   . Ischemic cardiomyopathy s/p CABG 2004   . Anxiety   . Diabetes mellitus without complication   . Cataract   . Sinus node dysfunction   . GERD (gastroesophageal reflux disease)   . Hypertension   . Metatarsalgia of both feet   . Debility, unspecified   . Gout, unspecified   . Urinary frequency   . Sebaceous cyst   . Other seborrheic keratosis   . Unspecified nonsenile cataract   . Pain in joint, shoulder region   . Cervicalgia   . Memory loss 07/20/2005  . Irritable bowel syndrome   . Neoplasm of uncertain behavior of lip, oral cavity, and pharynx   . Macular degeneration (senile) of retina, unspecified   . Syncope and collapse   . Blastomycosis   . Peptic ulcer, unspecified site, unspecified  as acute or chronic, without mention of hemorrhage, perforation, or obstruction   . Blood in stool   . Insomnia, unspecified   . Left bundle branch block   . Elevated prostate specific antigen (PSA)   . Lipoma of other skin and subcutaneous tissue   . Gastroparesis   . peripheral neuropathy   . Lumbago   . Carpal tunnel syndrome   . Osteoarthrosis, unspecified whether generalized or localized, unspecified site   . Achalasia and cardiospasm   . Pacemaker-Medtronic   . Diverticulitis of colon (without mention of hemorrhage)       Surgical History:  Past Surgical History  Procedure Laterality Date  . Cataract extraction    . Open heart surgery  12/1997    DR GAMBLE  . Eye surgery    . Vasectomy  1976  . Appendectomy  1995  . (r) ear implant      DR WELLS  . Nasal sinus surgery  1978    DR WELLS  . Shoulder arthroscopy w/ rotator cuff repair  1995    DR HARKINS  . Excise thrombosed hemorrhoid  07/1996    DR LEONE   . Minor hemorrhoidectomy  04/1998    DR LEONE   . Coronary artery bypass graft  01/2003    DR HENDRICKSON  . Pacemaker insertion  10/28/2004    DR MCQUEEN  . Nodules  removed left arm  05/02/2006    DR GROSS     Home Meds: Prior to Admission medications   Medication Sig Start Date End Date Taking? Authorizing Provider  aspirin 81 MG tablet Take 81 mg by mouth daily. Take 1 tablet daily to prevent heart attack, stroke and blood clot.   Yes Historical Provider, MD  carvedilol (COREG) 3.125 MG tablet Take 3.125 mg by mouth 2 (two) times daily with a meal. Take 1 tablet by mouth 2 times daily for heart.   Yes Historical Provider, MD  colchicine 0.6 MG tablet Take one tablet twice daily to help the pain in the feet 09/01/12  Yes Kimber Relic, MD  finasteride (PROSCAR) 5 MG tablet Take 5 mg by mouth daily. Tke one tablet once a day to help with prostate   Yes Historical Provider, MD  glucose blood (FREESTYLE LITE) test strip 1 each by Other route as needed for other.  Use as instructed, test blood sugar twice daily.   Yes Historical Provider, MD  Lancets (FREESTYLE) lancets 1 each by Other route as needed for other. Use as instructed   Yes Historical Provider, MD  metformin (FORTAMET) 500 MG (OSM) 24 hr tablet Take 500 mg by mouth daily with breakfast. Take one tablet once a day to control diabetes   Yes Historical Provider, MD  polyethylene glycol (MIRALAX / GLYCOLAX) packet Take 17 g by mouth daily. Take 17 grams in 8 oz of water daily as needed for constipation.   Yes Historical Provider, MD  simvastatin (ZOCOR) 40 MG tablet Take 40 mg by mouth every evening. Take one tablet once a day for cholesterol   Yes Historical Provider, MD  Vortioxetine HBr (BRINTELLIX) 10 MG TABS Take 10 mg by mouth daily. To help fatigue and depression 09/26/12  Yes Kimber Relic, MD  zolpidem (AMBIEN) 10 MG tablet Take 10 mg by mouth at bedtime as needed for sleep. Take one tablet at bedtime for sleep   Yes Historical Provider, MD    Allergies:  Allergies  Allergen Reactions  . Altace [Ramipril]   . Aricept [Donepezil Hcl]   . Celebrex [Celecoxib]   . Codeine     REACTION: ?  . Cymbalta [Duloxetine Hcl]   . Feldene [Piroxicam]   . Namenda [Memantine Hcl] Other (See Comments)    nightmares  . Ultram [Tramadol]     History   Social History  . Marital Status: Single    Spouse Name: N/A    Number of Children: N/A  . Years of Education: N/A   Occupational History  . Not on file.   Social History Main Topics  . Smoking status: Former Smoker    Types: Cigarettes    Quit date: 03/28/1938  . Smokeless tobacco: Never Used  . Alcohol Use: No  . Drug Use: No  . Sexual Activity: No   Other Topics Concern  . Not on file   Social History Narrative  . No narrative on file     Family History  Problem Relation Age of Onset  . Cancer Mother   . Heart disease Father   . Cancer Sister     breast  . Arthritis Brother   . Kidney disease Brother     Ureterolithiasis   . Heart disease Sister     CAD and stent     ROS:  Please see the history of present illness.     All other systems reviewed and negative.    Physical  Exam:   Blood pressure 132/80, pulse 72, height 6' (1.829 m), weight 182 lb (82.555 kg). General: Well developed, well nourished male in no acute distress. Head: Normocephalic, atraumatic, sclera non-icteric, no xanthomas, nares are without discharge. EENT: normal Lymph Nodes:  none Back: without scoliosis/kyphosis , no CVA tendersness Neck: Negative for carotid bruits. JVD not elevated. Lungs: Clear bilaterally to auscultation without wheezes, rales, or rhonchi. Breathing is unlabored. Device pocket well healed; without hematoma or erythema.  There is no tethering  Heart: RRR with S1 S2. No  murmur , rubs,+S4 appreciated. Abdomen: Soft, non-tender, non-distended with normoactive bowel sounds. No hepatomegaly. No rebound/guarding. No obvious abdominal masses. Msk:  Strength and tone appear normal for age. Extremities: No clubbing or cyanosis. No  edema.  Distal pedal pulses are 2+ and equal bilaterally. Skin: Warm and Dry Neuro: Alert and oriented X 3. CN III-XII intact Grossly normal sensory and motor function . Psych:  Responds to questions appropriately with a normal affect.      Labs: Cardiac Enzymes No results found for this basename: CKTOTAL, CKMB, TROPONINI,  in the last 72 hours CBC Lab Results  Component Value Date   WBC 11.0* 02/07/2008   HGB 16.3 02/07/2008   HCT 48.0 02/07/2008   MCV 91.9 02/07/2008   PLT 218 02/07/2008   PROTIME: No results found for this basename: LABPROT, INR,  in the last 72 hours Chemistry No results found for this basename: NA, K, CL, CO2, BUN, CREATININE, CALCIUM, LABALBU, PROT, BILITOT, ALKPHOS, ALT, AST, GLUCOSE,  in the last 168 hours Lipids Lab Results  Component Value Date   HDL 51 07/09/2012   LDLCALC 40 07/09/2012   TRIG 108 07/09/2012   BNP No results found for this basename:  probnp   Miscellaneous No results found for this basename: DDIMER    Radiology/Studies:  No results found.  EKG: Atrial pacing with LBBB   Assessment and Plan:   Sherryl Manges

## 2012-11-09 NOTE — Assessment & Plan Note (Signed)
Well controlled 

## 2012-11-09 NOTE — Assessment & Plan Note (Signed)
Stable on current meds Will get records from Dr Luberta Robertson

## 2012-11-09 NOTE — Assessment & Plan Note (Signed)
stable °

## 2012-11-09 NOTE — Assessment & Plan Note (Signed)
100 percent atriallly paced

## 2012-11-09 NOTE — Patient Instructions (Addendum)
Your physician recommends that you continue on your current medications as directed. Please refer to the Current Medication list given to you.  Needs medical release form signed so we can obtain records from Dr. Luberta Robertson before the end of August.  Your physician wants you to follow-up in: 6 months with Dr. Graciela Husbands. You will receive a reminder letter in the mail two months in advance. If you don't receive a letter, please call our office to schedule the follow-up appointment.

## 2012-11-12 ENCOUNTER — Encounter: Payer: Self-pay | Admitting: Internal Medicine

## 2012-11-12 ENCOUNTER — Telehealth: Payer: Self-pay | Admitting: Internal Medicine

## 2012-11-12 NOTE — Telephone Encounter (Signed)
Pt signed ROI to Obtain Records from [facility] office, called spoke with Rosann Auerbach  She stated records were already [place], Spoke with [facility] records have already been sent and their in  Scheduling Dept.  11/12/12/KM

## 2012-11-28 ENCOUNTER — Other Ambulatory Visit: Payer: Self-pay

## 2012-11-28 MED ORDER — ZOLPIDEM TARTRATE 10 MG PO TABS: 10.0000 mg | ORAL_TABLET | Freq: Every evening | ORAL | Status: DC | PRN

## 2012-12-27 ENCOUNTER — Institutional Professional Consult (permissible substitution): Payer: Self-pay | Admitting: Internal Medicine

## 2013-01-01 ENCOUNTER — Other Ambulatory Visit: Payer: Medicare Other

## 2013-01-01 DIAGNOSIS — I10 Essential (primary) hypertension: Secondary | ICD-10-CM

## 2013-01-01 DIAGNOSIS — R7309 Other abnormal glucose: Secondary | ICD-10-CM

## 2013-01-01 DIAGNOSIS — E785 Hyperlipidemia, unspecified: Secondary | ICD-10-CM

## 2013-01-01 DIAGNOSIS — T887XXA Unspecified adverse effect of drug or medicament, initial encounter: Secondary | ICD-10-CM

## 2013-01-02 ENCOUNTER — Encounter: Payer: Self-pay | Admitting: Internal Medicine

## 2013-01-02 ENCOUNTER — Ambulatory Visit (INDEPENDENT_AMBULATORY_CARE_PROVIDER_SITE_OTHER): Payer: Medicare Other | Admitting: Internal Medicine

## 2013-01-02 VITALS — BP 142/90 | HR 74 | Temp 98.3°F | Wt 183.0 lb

## 2013-01-02 DIAGNOSIS — R209 Unspecified disturbances of skin sensation: Secondary | ICD-10-CM

## 2013-01-02 DIAGNOSIS — K279 Peptic ulcer, site unspecified, unspecified as acute or chronic, without hemorrhage or perforation: Secondary | ICD-10-CM

## 2013-01-02 DIAGNOSIS — N401 Enlarged prostate with lower urinary tract symptoms: Secondary | ICD-10-CM

## 2013-01-02 DIAGNOSIS — F3289 Other specified depressive episodes: Secondary | ICD-10-CM

## 2013-01-02 DIAGNOSIS — R202 Paresthesia of skin: Secondary | ICD-10-CM | POA: Insufficient documentation

## 2013-01-02 DIAGNOSIS — F329 Major depressive disorder, single episode, unspecified: Secondary | ICD-10-CM

## 2013-01-02 DIAGNOSIS — I10 Essential (primary) hypertension: Secondary | ICD-10-CM

## 2013-01-02 DIAGNOSIS — I2510 Atherosclerotic heart disease of native coronary artery without angina pectoris: Secondary | ICD-10-CM

## 2013-01-02 DIAGNOSIS — E785 Hyperlipidemia, unspecified: Secondary | ICD-10-CM

## 2013-01-02 DIAGNOSIS — Z23 Encounter for immunization: Secondary | ICD-10-CM

## 2013-01-02 DIAGNOSIS — M25571 Pain in right ankle and joints of right foot: Secondary | ICD-10-CM

## 2013-01-02 DIAGNOSIS — G5631 Lesion of radial nerve, right upper limb: Secondary | ICD-10-CM

## 2013-01-02 DIAGNOSIS — R5381 Other malaise: Secondary | ICD-10-CM

## 2013-01-02 DIAGNOSIS — Z950 Presence of cardiac pacemaker: Secondary | ICD-10-CM

## 2013-01-02 DIAGNOSIS — R32 Unspecified urinary incontinence: Secondary | ICD-10-CM

## 2013-01-02 DIAGNOSIS — G5630 Lesion of radial nerve, unspecified upper limb: Secondary | ICD-10-CM

## 2013-01-02 DIAGNOSIS — N138 Other obstructive and reflux uropathy: Secondary | ICD-10-CM

## 2013-01-02 DIAGNOSIS — M25579 Pain in unspecified ankle and joints of unspecified foot: Secondary | ICD-10-CM | POA: Insufficient documentation

## 2013-01-02 LAB — COMPREHENSIVE METABOLIC PANEL
Albumin/Globulin Ratio: 1.9 (ref 1.1–2.5)
Albumin: 4.2 g/dL (ref 3.5–4.7)
Alkaline Phosphatase: 109 IU/L (ref 39–117)
BUN/Creatinine Ratio: 21 (ref 10–22)
BUN: 15 mg/dL (ref 8–27)
Calcium: 8.8 mg/dL (ref 8.6–10.2)
Creatinine, Ser: 0.72 mg/dL — ABNORMAL LOW (ref 0.76–1.27)
GFR calc Af Amer: 99 mL/min/{1.73_m2} (ref >59)
GFR calc non Af Amer: 86 mL/min/{1.73_m2} (ref >59)
Globulin, Total: 2.2 g/dL (ref 1.5–4.5)
Glucose: 188 mg/dL — ABNORMAL HIGH (ref 65–99)
Total Bilirubin: 0.4 mg/dL (ref 0.0–1.2)
Total Protein: 6.4 g/dL (ref 6.0–8.5)

## 2013-01-02 LAB — MICROALBUMIN / CREATININE URINE RATIO
Creatinine, Ur: 130.7 mg/dL (ref 22.0–328.0)
MICROALB/CREAT RATIO: 16.5 mg/g creat (ref 0.0–30.0)
Microalbumin, Urine: 21.6 ug/mL — ABNORMAL HIGH (ref 0.0–17.0)

## 2013-01-02 LAB — HEMOGLOBIN A1C: Hgb A1c MFr Bld: 6 % — ABNORMAL HIGH (ref 4.8–5.6)

## 2013-01-02 LAB — LIPID PANEL: Cholesterol, Total: 113 mg/dL (ref 100–199)

## 2013-01-02 MED ORDER — OXYBUTYNIN CHLORIDE 5 MG PO TABS: ORAL_TABLET | ORAL | Status: DC

## 2013-01-02 MED ORDER — OMEPRAZOLE 40 MG PO CPDR: DELAYED_RELEASE_CAPSULE | ORAL | Status: DC

## 2013-01-02 NOTE — Patient Instructions (Signed)
Continue medications as listed 

## 2013-01-02 NOTE — Progress Notes (Signed)
Subjective:    Patient ID: Dustin Davila, male    DOB: [birth year], 79 y.o.   MRN: [id number]   Chief Complaint  Patient presents with  . Urinary Incontinence    x couple months patient c/o urinary incontinence   . Hyperglycemia    questionable if BS running high   . Gait Problem    balance concerns x couple months-? related to elevated BS   . Numbness    right hand numbness x 6 months or longer     HPI HYPERTENSION: controlled  HYPERLIPIDEMIA: controlled  Depressive disorder, not elsewhere classified: acknowledges he remains depressed  Hypertrophy of prostate with urinary obstruction and other lower urinary tract symptoms (LUTS)  CORONARY ARTERY DISEASE  s/p CABG: asymptomatic  Need for prophylactic vaccination and inoculation against influenza  Having nausea after meals. Seems to him like when his ulcer was active.  Has pink in stools sometimes for the last month. No pain or cramping with stools.  Continues with complaints of fatigue.  Numb in the right thumb and index finger for 6-8 months. No known injury to wrist or hand.   Current Outpatient Prescriptions on File Prior to Visit  Medication Sig Dispense Refill  . aspirin 81 MG tablet Take 81 mg by mouth daily. Take 1 tablet daily to prevent heart attack, stroke and blood clot.      . carvedilol (COREG) 3.125 MG tablet Take 3.125 mg by mouth 2 (two) times daily with a meal. Take 1 tablet by mouth 2 times daily for heart.      . finasteride (PROSCAR) 5 MG tablet Take 5 mg by mouth daily. Tke one tablet once a day to help with prostate      . glucose blood (FREESTYLE LITE) test strip 1 each by Other route as needed for other. Use as instructed, test blood sugar twice daily.      . Lancets (FREESTYLE) lancets 1 each by Other route as needed for other. Use as instructed      . metformin (FORTAMET) 500 MG (OSM) 24 hr tablet Take 500 mg by mouth daily with breakfast. Take one tablet once a day to control diabetes       . polyethylene glycol (MIRALAX / GLYCOLAX) packet Take 17 g by mouth daily. Take 17 grams in 8 oz of water daily as needed for constipation.      . simvastatin (ZOCOR) 40 MG tablet Take 40 mg by mouth every evening. Take one tablet once a day for cholesterol      . Vortioxetine HBr (BRINTELLIX) 10 MG TABS Take 10 mg by mouth daily. To help fatigue and depression  30 tablet  5  . zolpidem (AMBIEN) 10 MG tablet Take 1 tablet (10 mg total) by mouth at bedtime as needed for sleep. Take one tablet at bedtime for sleep  30 tablet  1   No current facility-administered medications on file prior to visit.    Review of Systems  Constitutional: Positive for activity change, appetite change and fatigue.  HENT: Positive for hearing loss.   Eyes: Negative.   Respiratory: Positive for shortness of breath.   Cardiovascular: Positive for leg swelling. Negative for chest pain and palpitations.       Pacemaker  Gastrointestinal:       Prior history includes intermittent diarrhea, intermittent constipation, heartburn and reflux, and occasional nausea. He has had some difficulty with swallowing in the past. Appetite is poor. He frequently feels full befor finishing a meal.  Endocrine: Negative.   Genitourinary:       Delays in starting urination. Hesitancy. Nocturia x2.  Musculoskeletal:       Arthritis. Shoulder pain and neck pains.  Skin: Negative.   Neurological:       Numb, especially in the right hand. Loss of sensation in the feet. Unsteady. Memory loss. History of confusion.  Hematological: Negative.   Psychiatric/Behavioral:       Insomnia. fatigued. Feeling sad most of the time. Worries about his demented wife who is in a nursing facility.       Objective:BP 142/90  Pulse 74  Temp(Src) 98.3 F (36.8 C) (Oral)  Wt 183 lb (83.008 kg)  BMI 24.81 kg/m2  SpO2 96%    Physical Exam  Constitutional: He is oriented to person, place, and time. He appears well-nourished. He appears distressed.   HENT:  Bilateral partial hearing deficits.  Eyes:  Corrective lens.  Neck: Normal range of motion. Neck supple. No JVD present. No tracheal deviation present. No thyromegaly present.  Cardiovascular: Normal rate, regular rhythm and normal heart sounds.  Exam reveals no gallop and no friction rub.   No murmur heard. Pacemaker left upper chest wall.  Pulmonary/Chest: Effort normal and breath sounds normal. No respiratory distress. He has no wheezes. He has no rales.  Abdominal: Soft. Bowel sounds are normal. He exhibits no distension and no mass. There is no tenderness.  Musculoskeletal: Normal range of motion. He exhibits edema. He exhibits no tenderness.  Lymphadenopathy:    He has no cervical adenopathy.  Neurological: He is alert and oriented to person, place, and time. No cranial nerve deficit. Coordination normal.  Skin: Skin is warm and dry. No rash noted. No erythema. No pallor.  Psychiatric:  Sad. Speaks slowly at times.      Lab on 01/01/2013  Component Date Value Range Status  . Cholesterol, Total 01/01/2013 113  100 - 199 mg/dL Final  . Triglycerides 01/01/2013 160* 0 - 149 mg/dL Final  . HDL 97/98/9211 45  >39 mg/dL Final   Comment: According to ATP-III Guidelines, HDL-C >59 mg/dL is considered a                          negative risk factor for CHD.  Marland Kitchen VLDL Cholesterol Cal 01/01/2013 32  5 - 40 mg/dL Final  . LDL Calculated 01/01/2013 36  0 - 99 mg/dL Final  . Chol/HDL Ratio 01/01/2013 2.5  0.0 - 5.0 ratio units Final   Comment:                                   T. Chol/HDL Ratio                                                                      Men  Women                                                        1/2 Avg.Risk  3.4    3.3                                                            Avg.Risk  5.0    4.4                                                         2X Avg.Risk  9.6    7.1                                                         3X Avg.Risk 23.4    11.0  . Glucose 01/01/2013 188* 65 - 99 mg/dL Final  . BUN 91/47/8295 15  8 - 27 mg/dL Final  . Creatinine, Ser 01/01/2013 0.72* 0.76 - 1.27 mg/dL Final  . GFR calc non Af Amer 01/01/2013 86  >59 mL/min/1.73 Final  . GFR calc Af Amer 01/01/2013 99  >59 mL/min/1.73 Final  . BUN/Creatinine Ratio 01/01/2013 21  10 - 22 Final  . Sodium 01/01/2013 142  134 - 144 mmol/L Final  . Potassium 01/01/2013 3.9  3.5 - 5.2 mmol/L Final  . Chloride 01/01/2013 102  97 - 108 mmol/L Final  . CO2 01/01/2013 25  18 - 29 mmol/L Final  . Calcium 01/01/2013 8.8  8.6 - 10.2 mg/dL Final  . Total Protein 01/01/2013 6.4  6.0 - 8.5 g/dL Final  . Albumin 62/13/0865 4.2  3.5 - 4.7 g/dL Final  . Globulin, Total 01/01/2013 2.2  1.5 - 4.5 g/dL Final  . Albumin/Globulin Ratio 01/01/2013 1.9  1.1 - 2.5 Final  . Total Bilirubin 01/01/2013 0.4  0.0 - 1.2 mg/dL Final  . Alkaline Phosphatase 01/01/2013 109  39 - 117 IU/L Final  . AST 01/01/2013 17  0 - 40 IU/L Final  . ALT 01/01/2013 13  0 - 44 IU/L Final  . Hemoglobin A1C 01/01/2013 6.0* 4.8 - 5.6 % Final   Comment:          Increased risk for diabetes: 5.7 - 6.4                                   Diabetes: >6.4                                   Glycemic control for adults with diabetes: <7.0  . Estimated average glucose 01/01/2013 126   Final  . Creatinine, Ur 01/01/2013 130.7  22.0 - 328.0 mg/dL Final  . Microalbum.,U,Random 01/01/2013 21.6* 0.0 - 17.0 ug/mL Final  . MICROALB/CREAT RATIO 01/01/2013 16.5  0.0 - 30.0 mg/g creat Final       Assessment & Plan:  HYPERTENSION - Plan: Basic metabolic panel  HYPERLIPIDEMIA: well controlled  Depressive disorder, not elsewhere classified: he does not want to try additional antidepressant medications  Hypertrophy of prostate with urinary obstruction and other lower urinary tract symptoms (LUTS):hesitation  CORONARY ARTERY DISEASE  s/p CABG: no angina, dyspnea, or palpitations  Pacemaker-[employer]: functioning  properly  Peptic ulcer, unspecified site, unspecified as acute or chronic, without mention of hemorrhage, perforation, or obstruction : recent increase in dyspepsia- Plan: omeprazole (PRILOSEC) 40 MG capsule  Need for prophylactic vaccination and inoculation against influenza: administered  Paresthesia of hand: he does not wast PNCV  Radial nerve dysfunction, right: prior history. Does not want referral to hand surgeon or more evaluation  Pain in joint, ankle and foot, right: stable. Likely arthritis. Has been attributed to gout in the past, but I am not sure this explains the pain.  Other malaise and fatigue - Plan: CBC with Differential  Unspecified urinary incontinence - Plan: oxybutynin (DITROPAN) 5 MG tablet

## 2013-01-25 ENCOUNTER — Other Ambulatory Visit: Payer: Self-pay

## 2013-01-25 MED ORDER — ZOLPIDEM TARTRATE 10 MG PO TABS: 10.0000 mg | ORAL_TABLET | Freq: Every evening | ORAL | Status: DC | PRN

## 2013-01-25 NOTE — Telephone Encounter (Signed)
RX was received manually, responded with call into pharmacy

## 2013-04-01 ENCOUNTER — Other Ambulatory Visit: Payer: Self-pay | Admitting: *Deleted

## 2013-04-01 MED ORDER — ZOLPIDEM TARTRATE 10 MG PO TABS: ORAL_TABLET | ORAL | Status: DC

## 2013-04-02 ENCOUNTER — Other Ambulatory Visit: Payer: Self-pay | Admitting: *Deleted

## 2013-05-09 ENCOUNTER — Other Ambulatory Visit: Payer: Medicare Other

## 2013-05-14 ENCOUNTER — Ambulatory Visit: Payer: Medicare Other | Admitting: Internal Medicine

## 2013-05-31 ENCOUNTER — Other Ambulatory Visit: Payer: Self-pay

## 2013-06-04 ENCOUNTER — Ambulatory Visit (INDEPENDENT_AMBULATORY_CARE_PROVIDER_SITE_OTHER): Payer: Medicare Other | Admitting: Internal Medicine

## 2013-06-04 ENCOUNTER — Encounter: Payer: Self-pay | Admitting: Internal Medicine

## 2013-06-04 VITALS — BP 132/84 | HR 84 | Temp 98.8°F | Wt 185.0 lb

## 2013-06-04 DIAGNOSIS — G309 Alzheimer's disease, unspecified: Secondary | ICD-10-CM

## 2013-06-04 DIAGNOSIS — N401 Enlarged prostate with lower urinary tract symptoms: Secondary | ICD-10-CM

## 2013-06-04 DIAGNOSIS — I2510 Atherosclerotic heart disease of native coronary artery without angina pectoris: Secondary | ICD-10-CM

## 2013-06-04 DIAGNOSIS — G4700 Insomnia, unspecified: Secondary | ICD-10-CM

## 2013-06-04 DIAGNOSIS — E119 Type 2 diabetes mellitus without complications: Secondary | ICD-10-CM | POA: Insufficient documentation

## 2013-06-04 DIAGNOSIS — I10 Essential (primary) hypertension: Secondary | ICD-10-CM

## 2013-06-04 DIAGNOSIS — R32 Unspecified urinary incontinence: Secondary | ICD-10-CM

## 2013-06-04 DIAGNOSIS — Z9181 History of falling: Secondary | ICD-10-CM

## 2013-06-04 DIAGNOSIS — E785 Hyperlipidemia, unspecified: Secondary | ICD-10-CM

## 2013-06-04 DIAGNOSIS — K921 Melena: Secondary | ICD-10-CM

## 2013-06-04 DIAGNOSIS — F0280 Dementia in other diseases classified elsewhere without behavioral disturbance: Secondary | ICD-10-CM

## 2013-06-04 DIAGNOSIS — N138 Other obstructive and reflux uropathy: Secondary | ICD-10-CM

## 2013-06-04 MED ORDER — ZOLPIDEM TARTRATE 10 MG PO TABS: ORAL_TABLET | ORAL | Status: DC

## 2013-06-04 NOTE — Patient Instructions (Addendum)
Use medication as listed.  Consider letting us refer you to a urologist for evaluation of incontinence.

## 2013-06-04 NOTE — Progress Notes (Signed)
Patient ID: Dustin Davila, male   DOB: [birth year], 80 y.o.   MRN: [id number]    Location:    [facility]  Place of Service:  OFFICE    Allergies  Allergen Reactions  . Altace [Ramipril]   . Aricept [Donepezil Hcl]   . Celebrex [Celecoxib]   . Codeine     REACTION: ?  . Cymbalta [Duloxetine Hcl]   . Feldene [Piroxicam]   . Namenda [Memantine Hcl] Other (See Comments)    nightmares  . Ultram [Tramadol]     Chief Complaint  Patient presents with  . Medical Managment of Chronic Issues    6 month follow-up   . Medication Refill    [facility]     HPI:  HYPERTENSION - controlled  HYPERLIPIDEMIA - needs recheck  Diabetes mellitus without complication - controlled  CORONARY ARTERY DISEASE  s/p CABG: no chest discomfort  Unspecified urinary incontinence - sudden incontinent episodes. Known BPH on finasteride and oxybutynin  Personal history of fall: no injury. Balance is not as reliable. Tripped in the yard  Hypertrophy of prostate with urinary obstruction and other lower urinary tract symptoms (LUTS); urinary incontinence  Alzheimer's disease: he is aware of worsening memory  Insomnia, unspecified - benefits from zolpidem (AMBIEN) 10 MG tablet  Blood in stool - dark stools sometimes    Medications: Patient's Medications  New Prescriptions   No medications on file  Previous Medications   ASPIRIN 81 MG TABLET    Take 81 mg by mouth daily. Take 1 tablet daily to prevent heart attack, stroke and blood clot.   CARVEDILOL (COREG) 3.125 MG TABLET    Take 3.125 mg by mouth 2 (two) times daily with a meal. Take 1 tablet by mouth 2 times daily for heart.   FINASTERIDE (PROSCAR) 5 MG TABLET    Take 5 mg by mouth daily. Tke one tablet once a day to help with prostate   GLUCOSE BLOOD (FREESTYLE LITE) TEST STRIP    1 each by Other route as needed for other. Use as instructed, test blood sugar twice daily.   LANCETS (FREESTYLE) LANCETS    1 each by Other route as needed for  other. Use as instructed   METFORMIN (FORTAMET) 500 MG (OSM) 24 HR TABLET    Take 500 mg by mouth daily with breakfast. Take one tablet once a day to control diabetes   OMEPRAZOLE (PRILOSEC) 40 MG CAPSULE    One daily to reduce stomach acid and prevent ulcer.   OXYBUTYNIN (DITROPAN) 5 MG TABLET    One up to 3 times daily to help bladder control   POLYETHYLENE GLYCOL (MIRALAX / GLYCOLAX) PACKET    Take 17 g by mouth daily. Take 17 grams in 8 oz of water daily as needed for constipation.   SIMVASTATIN (ZOCOR) 40 MG TABLET    Take 40 mg by mouth every evening. Take one tablet once a day for cholesterol   ZOLPIDEM (AMBIEN) 10 MG TABLET    Take one tablet at bedtime for sleep  Modified Medications   No medications on file  Discontinued Medications   No medications on file     Review of Systems  Constitutional: Positive for activity change, appetite change and fatigue.  HENT: Positive for hearing loss.   Eyes: Negative.   Respiratory: Positive for shortness of breath.   Cardiovascular: Positive for leg swelling. Negative for chest pain and palpitations.       Pacemaker  Gastrointestinal:  Prior history includes intermittent diarrhea, intermittent constipation, heartburn and reflux, and occasional nausea. He has had some difficulty with swallowing in the past. Appetite is poor. He frequently feels full befor finishing a meal.  Endocrine: Negative.   Genitourinary:       Delays in starting urination. Hesitancy. Nocturia x2.  Musculoskeletal:       Arthritis. Shoulder pain and neck pains.  Skin: Negative.   Neurological:       Numb, especially in the right hand. Loss of sensation in the feet. Unsteady. Memory loss. History of confusion. Memory getting worse.  Hematological: Negative.   Psychiatric/Behavioral:       Insomnia. fatigued. Feeling sad most of the time. Worries about his demented wife who is in a nursing facility.  All other systems reviewed and are negative.    Filed  Vitals:   06/04/13 1335  BP: 132/84  Pulse: 84  Temp: 98.8 F (37.1 C)  TempSrc: Oral  Weight: 185 lb (83.915 kg)  SpO2: 96%   Physical Exam  Constitutional: He is oriented to person, place, and time. He appears well-nourished. He appears distressed.  HENT:  Bilateral partial hearing deficits.  Eyes:  Corrective lens.  Neck: Normal range of motion. Neck supple. No JVD present. No tracheal deviation present. No thyromegaly present.  Cardiovascular: Normal rate, regular rhythm and normal heart sounds.  Exam reveals no gallop and no friction rub.   No murmur heard. Pacemaker left upper chest wall.  Pulmonary/Chest: Effort normal and breath sounds normal. No respiratory distress. He has no wheezes. He has no rales.  Abdominal: Soft. Bowel sounds are normal. He exhibits no distension and no mass. There is no tenderness.  Musculoskeletal: Normal range of motion. He exhibits edema. He exhibits no tenderness.  Lymphadenopathy:    He has no cervical adenopathy.  Neurological: He is alert and oriented to person, place, and time. No cranial nerve deficit. Coordination normal.  Skin: Skin is warm and dry. No rash noted. No erythema. No pallor.  Psychiatric:  Sad. Speaks slowly at times.     Labs reviewed: No visits with results within 3 Month(s) from this visit. Latest known visit with results is:  Lab on 01/01/2013  Component Date Value Ref Range Status  . Cholesterol, Total 01/01/2013 113  100 - 199 mg/dL Final  . Triglycerides 01/01/2013 160* 0 - 149 mg/dL Final  . HDL 01/01/2013 45  >39 mg/dL Final   Comment: According to ATP-III Guidelines, HDL-C >59 mg/dL is considered a                          negative risk factor for CHD.  Marland Kitchen VLDL Cholesterol Cal 01/01/2013 32  5 - 40 mg/dL Final  . LDL Calculated 01/01/2013 36  0 - 99 mg/dL Final  . Chol/HDL Ratio 01/01/2013 2.5  0.0 - 5.0 ratio units Final   Comment:                                   T. Chol/HDL Ratio  Men  Women                                                        1/2 Avg.Risk  3.4    3.3                                                            Avg.Risk  5.0    4.4                                                         2X Avg.Risk  9.6    7.1                                                         3X Avg.Risk 23.4   11.0  . Glucose 01/01/2013 188* 65 - 99 mg/dL Final  . BUN 78/29/5621 15  8 - 27 mg/dL Final  . Creatinine, Ser 01/01/2013 0.72* 0.76 - 1.27 mg/dL Final  . GFR calc non Af Amer 01/01/2013 86  >59 mL/min/1.73 Final  . GFR calc Af Amer 01/01/2013 99  >59 mL/min/1.73 Final  . BUN/Creatinine Ratio 01/01/2013 21  10 - 22 Final  . Sodium 01/01/2013 142  134 - 144 mmol/L Final  . Potassium 01/01/2013 3.9  3.5 - 5.2 mmol/L Final  . Chloride 01/01/2013 102  97 - 108 mmol/L Final  . CO2 01/01/2013 25  18 - 29 mmol/L Final  . Calcium 01/01/2013 8.8  8.6 - 10.2 mg/dL Final  . Total Protein 01/01/2013 6.4  6.0 - 8.5 g/dL Final  . Albumin 30/86/5784 4.2  3.5 - 4.7 g/dL Final  . Globulin, Total 01/01/2013 2.2  1.5 - 4.5 g/dL Final  . Albumin/Globulin Ratio 01/01/2013 1.9  1.1 - 2.5 Final  . Total Bilirubin 01/01/2013 0.4  0.0 - 1.2 mg/dL Final  . Alkaline Phosphatase 01/01/2013 109  39 - 117 IU/L Final  . AST 01/01/2013 17  0 - 40 IU/L Final  . ALT 01/01/2013 13  0 - 44 IU/L Final  . Hemoglobin A1C 01/01/2013 6.0* 4.8 - 5.6 % Final   Comment:          Increased risk for diabetes: 5.7 - 6.4                                   Diabetes: >6.4                                   Glycemic control for adults with diabetes: <7.0  . Estimated average glucose 01/01/2013 126   Final  . Creatinine, Ur 01/01/2013 130.7  22.0 - 328.0  mg/dL Final  . Microalbum.,U,Random 01/01/2013 21.6* 0.0 - 17.0 ug/mL Final  . MICROALB/CREAT RATIO 01/01/2013 16.5  0.0 - 30.0 mg/g creat Final      Assessment/Plan  1. HYPERTENSION controlled - CMP; Future -  Basic metabolic panel  2. HYPERLIPIDEMIA recheck - Lipid panel; Future  3. Diabetes mellitus without complication controlled - Hemoglobin A1c; Future - CMP; Future - Hemoglobin [id number] - Basic metabolic panel  4. CORONARY ARTERY DISEASE  s/p CABG asymptomatic  5. Unspecified urinary incontinence Recommend urology consult - Urinalysis  6. Personal history of fall Use caution  7. Hypertrophy of prostate with urinary obstruction and other lower urinary tract symptoms (LUTS) Recommend urology consult  8. Alzheimer's disease worse  9. Insomnia, unspecified - zolpidem (AMBIEN) 10 MG tablet; Take one tablet at bedtime for sleep  Dispense: 30 tablet; Refill: 4  10. Blood in stool - CBC With differential/Platelet

## 2013-06-05 LAB — CBC WITH DIFFERENTIAL
BASOS: 0 %
Basophils Absolute: 0 10*3/uL (ref 0.0–0.2)
Eos: 1 %
Eosinophils Absolute: 0 10*3/uL (ref 0.0–0.4)
HEMATOCRIT: 42.1 % (ref 37.5–51.0)
HEMOGLOBIN: 14.4 g/dL (ref 12.6–17.7)
Immature Grans (Abs): 0 10*3/uL (ref 0.0–0.1)
Immature Granulocytes: 0 %
LYMPHS ABS: 2.3 10*3/uL (ref 0.7–3.1)
LYMPHS: 31 %
MCH: 29.8 pg (ref 26.6–33.0)
MCHC: 34.2 g/dL (ref 31.5–35.7)
MCV: 87 fL (ref 79–97)
Monocytes Absolute: 0.4 10*3/uL (ref 0.1–0.9)
Monocytes: 6 %
NEUTROS ABS: 4.7 10*3/uL (ref 1.4–7.0)
Neutrophils Relative %: 62 %
Platelets: 178 10*3/uL (ref 150–379)
RBC: 4.83 x10E6/uL (ref 4.14–5.80)
RDW: 13.5 % (ref 12.3–15.4)
WBC: 7.6 10*3/uL (ref 3.4–10.8)

## 2013-06-05 LAB — BASIC METABOLIC PANEL
BUN/Creatinine Ratio: 23 — ABNORMAL HIGH (ref 10–22)
BUN: 18 mg/dL (ref 8–27)
CO2: 22 mmol/L (ref 18–29)
CREATININE: 0.79 mg/dL (ref 0.76–1.27)
Calcium: 9.1 mg/dL (ref 8.6–10.2)
Chloride: 106 mmol/L (ref 97–108)
GFR, EST AFRICAN AMERICAN: 95 mL/min/{1.73_m2} (ref >59)
GFR, EST NON AFRICAN AMERICAN: 82 mL/min/{1.73_m2} (ref >59)
GLUCOSE: 112 mg/dL — AB (ref 65–99)
POTASSIUM: 4.2 mmol/L (ref 3.5–5.2)
Sodium: 146 mmol/L — ABNORMAL HIGH (ref 134–144)

## 2013-06-05 LAB — URINALYSIS
BILIRUBIN UA: NEGATIVE
Ketones, UA: NEGATIVE
Leukocytes, UA: NEGATIVE
NITRITE UA: NEGATIVE
PH UA: 5.5 (ref 5.0–7.5)
Protein, UA: NEGATIVE
SPEC GRAV UA: 1.025 (ref 1.005–1.030)
UUROB: 0.2 mg/dL (ref 0.0–1.9)

## 2013-06-05 LAB — HEMOGLOBIN A1C
Est. average glucose Bld gHb Est-mCnc: 128 mg/dL
Hgb A1c MFr Bld: 6.1 % — ABNORMAL HIGH (ref 4.8–5.6)

## 2013-06-06 ENCOUNTER — Encounter: Payer: Self-pay | Admitting: *Deleted

## 2013-07-29 ENCOUNTER — Other Ambulatory Visit: Payer: Self-pay | Admitting: *Deleted

## 2013-07-29 MED ORDER — METFORMIN HCL ER (OSM) 500 MG PO TB24: ORAL_TABLET | ORAL | Status: DC

## 2013-08-05 ENCOUNTER — Other Ambulatory Visit: Payer: Self-pay | Admitting: *Deleted

## 2013-08-05 MED ORDER — TAMSULOSIN HCL 0.4 MG PO CAPS: ORAL_CAPSULE | ORAL | Status: DC

## 2013-08-12 ENCOUNTER — Other Ambulatory Visit: Payer: Self-pay | Admitting: *Deleted

## 2013-08-12 MED ORDER — SIMVASTATIN 40 MG PO TABS: ORAL_TABLET | ORAL | Status: DC

## 2013-08-12 NOTE — Telephone Encounter (Signed)
[facility] 

## 2013-10-07 ENCOUNTER — Other Ambulatory Visit: Payer: Medicare Other

## 2013-10-07 DIAGNOSIS — I10 Essential (primary) hypertension: Secondary | ICD-10-CM

## 2013-10-07 DIAGNOSIS — E785 Hyperlipidemia, unspecified: Secondary | ICD-10-CM

## 2013-10-07 DIAGNOSIS — E119 Type 2 diabetes mellitus without complications: Secondary | ICD-10-CM

## 2013-10-08 LAB — LIPID PANEL
CHOL/HDL RATIO: 2.6 ratio (ref 0.0–5.0)
Cholesterol, Total: 114 mg/dL (ref 100–199)
HDL: 44 mg/dL (ref >39)
LDL CALC: 50 mg/dL (ref 0–99)
TRIGLYCERIDES: 98 mg/dL (ref 0–149)
VLDL Cholesterol Cal: 20 mg/dL (ref 5–40)

## 2013-10-08 LAB — COMPREHENSIVE METABOLIC PANEL
ALBUMIN: 4.3 g/dL (ref 3.5–4.7)
ALK PHOS: 98 IU/L (ref 39–117)
ALT: 18 IU/L (ref 0–44)
AST: 22 IU/L (ref 0–40)
Albumin/Globulin Ratio: 2 (ref 1.1–2.5)
BUN / CREAT RATIO: 20 (ref 10–22)
BUN: 17 mg/dL (ref 8–27)
CO2: 25 mmol/L (ref 18–29)
Calcium: 10 mg/dL (ref 8.6–10.2)
Chloride: 105 mmol/L (ref 97–108)
Creatinine, Ser: 0.86 mg/dL (ref 0.76–1.27)
GFR calc Af Amer: 91 mL/min/{1.73_m2} (ref >59)
GFR calc non Af Amer: 79 mL/min/{1.73_m2} (ref >59)
GLOBULIN, TOTAL: 2.2 g/dL (ref 1.5–4.5)
Glucose: 132 mg/dL — ABNORMAL HIGH (ref 65–99)
Potassium: 4 mmol/L (ref 3.5–5.2)
SODIUM: 144 mmol/L (ref 134–144)
Total Bilirubin: 0.9 mg/dL (ref 0.0–1.2)
Total Protein: 6.5 g/dL (ref 6.0–8.5)

## 2013-10-08 LAB — HEMOGLOBIN A1C
Est. average glucose Bld gHb Est-mCnc: 126 mg/dL
Hgb A1c MFr Bld: 6 % — ABNORMAL HIGH (ref 4.8–5.6)

## 2013-10-09 ENCOUNTER — Ambulatory Visit (INDEPENDENT_AMBULATORY_CARE_PROVIDER_SITE_OTHER): Payer: Medicare Other | Admitting: Internal Medicine

## 2013-10-09 ENCOUNTER — Encounter: Payer: Self-pay | Admitting: Internal Medicine

## 2013-10-09 VITALS — BP 148/84 | HR 73 | Temp 97.4°F | Wt 185.6 lb

## 2013-10-09 DIAGNOSIS — K2100 Gastro-esophageal reflux disease with esophagitis, without bleeding: Secondary | ICD-10-CM

## 2013-10-09 DIAGNOSIS — N138 Other obstructive and reflux uropathy: Secondary | ICD-10-CM

## 2013-10-09 DIAGNOSIS — N401 Enlarged prostate with lower urinary tract symptoms: Secondary | ICD-10-CM

## 2013-10-09 DIAGNOSIS — R32 Unspecified urinary incontinence: Secondary | ICD-10-CM

## 2013-10-09 DIAGNOSIS — F0280 Dementia in other diseases classified elsewhere without behavioral disturbance: Secondary | ICD-10-CM

## 2013-10-09 DIAGNOSIS — J449 Chronic obstructive pulmonary disease, unspecified: Secondary | ICD-10-CM

## 2013-10-09 DIAGNOSIS — M25571 Pain in right ankle and joints of right foot: Secondary | ICD-10-CM

## 2013-10-09 DIAGNOSIS — K220 Achalasia of cardia: Secondary | ICD-10-CM

## 2013-10-09 DIAGNOSIS — E785 Hyperlipidemia, unspecified: Secondary | ICD-10-CM

## 2013-10-09 DIAGNOSIS — G309 Alzheimer's disease, unspecified: Secondary | ICD-10-CM

## 2013-10-09 DIAGNOSIS — I10 Essential (primary) hypertension: Secondary | ICD-10-CM

## 2013-10-09 DIAGNOSIS — M25579 Pain in unspecified ankle and joints of unspecified foot: Secondary | ICD-10-CM

## 2013-10-09 DIAGNOSIS — I2510 Atherosclerotic heart disease of native coronary artery without angina pectoris: Secondary | ICD-10-CM

## 2013-10-09 DIAGNOSIS — Z950 Presence of cardiac pacemaker: Secondary | ICD-10-CM

## 2013-10-09 DIAGNOSIS — E119 Type 2 diabetes mellitus without complications: Secondary | ICD-10-CM

## 2013-10-09 MED ORDER — SOLIFENACIN SUCCINATE 10 MG PO TABS: ORAL_TABLET | ORAL | Status: DC

## 2013-10-09 MED ORDER — NITROGLYCERIN 0.4 MG SL SUBL: 0.4000 mg | SUBLINGUAL_TABLET | SUBLINGUAL | Status: DC | PRN

## 2013-10-09 NOTE — Progress Notes (Signed)
Patient ID: Dustin Davila, male   DOB: [birth year], 80 y.o.   MRN: [id number]    Location:    [facility]  Place of Service:  OFFICE    Allergies  Allergen Reactions  . Altace [Ramipril]   . Aricept [Donepezil Hcl]   . Celebrex [Celecoxib]   . Codeine     REACTION: ?  . Cymbalta [Duloxetine Hcl]   . Feldene [Piroxicam]   . Namenda [Memantine Hcl] Other (See Comments)    nightmares  . Ultram [Tramadol]     Chief Complaint  Patient presents with  . Medical Management of Chronic Issues    4 month follow-up, discuss labs (copy printed)   . Balance Issues    Patient c/o being unstable x couple months   . MMSE    27/30, failed clock drawing    HPI:  Recent chest pain at rest sitting in recliner. Became a little short of breath. No diaphoresis. Has history of CAD and esophageal problems. Has not had frequent angina. Has had some increase in difficulty swallowing. Has hx of esophageal dilation.  Diabetes mellitus without complication - Plan: Hemoglobin A1c, Microalbumin, urine, Basic metabolic panel  HYPERTENSION - Plan: Basic metabolic panel  HYPERLIPIDEMIA  CORONARY ARTERY DISEASE  s/p CABG - Plan: nitroGLYCERIN (NITROSTAT) 0.4 MG SL tablet, Ambulatory referral to Cardiology  COPD  Alzheimer's disease  Hypertrophy of prostate with urinary obstruction and other lower urinary tract symptoms (LUTS)  Reflux esophagitis  Pain in joint, ankle and foot, right  Unspecified urinary incontinence  Achalasia and cardiospasm  Cardiac pacemaker in situ    Medications: Patient's Medications  New Prescriptions   No medications on file  Previous Medications   ASPIRIN 81 MG TABLET    Take 81 mg by mouth daily. Take 1 tablet daily to prevent heart attack, stroke and blood clot.   CARVEDILOL (COREG) 3.125 MG TABLET    Take 3.125 mg by mouth 2 (two) times daily with a meal. Take 1 tablet by mouth 2 times daily for heart.   FINASTERIDE (PROSCAR) 5 MG TABLET    Take 5 mg by mouth  daily. Tke one tablet once a day to help with prostate   GLUCOSE BLOOD (FREESTYLE LITE) TEST STRIP    1 each by Other route as needed for other. Use as instructed, test blood sugar twice daily.   LANCETS (FREESTYLE) LANCETS    1 each by Other route as needed for other. Use as instructed   METFORMIN (FORTAMET) 500 MG (OSM) 24 HR TABLET    Take one tablet once a day to control diabetes   OMEPRAZOLE (PRILOSEC) 40 MG CAPSULE    One daily to reduce stomach acid and prevent ulcer.   OXYBUTYNIN (DITROPAN) 5 MG TABLET    One up to 3 times daily to help bladder control   POLYETHYLENE GLYCOL (MIRALAX / GLYCOLAX) PACKET    Take 17 g by mouth daily. Take 17 grams in 8 oz of water daily as needed for constipation.   SIMVASTATIN (ZOCOR) 40 MG TABLET    Take one tablet by mouth once daily to lower cholesterol   TAMSULOSIN (FLOMAX) 0.4 MG CAPS CAPSULE    Take 1 capsule by mouth once daily to help prostate   ZOLPIDEM (AMBIEN) 10 MG TABLET    Take one tablet at bedtime for sleep  Modified Medications   No medications on file  Discontinued Medications   No medications on file     Review of Systems  Constitutional: Positive  for activity change, appetite change and fatigue.  HENT: Positive for hearing loss.   Eyes: Negative.   Respiratory: Positive for chest tightness and shortness of breath.   Cardiovascular: Positive for chest pain and leg swelling. Negative for palpitations.       Pacemaker  Gastrointestinal:       Prior history includes intermittent diarrhea, intermittent constipation, heartburn and reflux, and occasional nausea. He has had some difficulty with swallowing in the past. Appetite is poor. He frequently feels full befor finishing a meal.  Endocrine: Negative.   Genitourinary:       Delays in starting urination. Hesitancy. Nocturia x2.  Musculoskeletal:       Arthritis. Shoulder pain and neck pains.  Skin: Negative.   Neurological:       Numb, especially in the right hand. Loss of  sensation in the feet. Unsteady. Memory loss. History of confusion. Memory getting worse.  Hematological: Negative.   Psychiatric/Behavioral:       Insomnia. fatigued. Feeling sad most of the time. Worries about his demented wife who is in a nursing facility.  All other systems reviewed and are negative.   Filed Vitals:   10/09/13 1030  BP: 148/84  Pulse: 73  Temp: 97.4 F (36.3 C)  TempSrc: Oral  Weight: 185 lb 9.6 oz (84.188 kg)  SpO2: 97%   Body mass index is 25.17 kg/(m^2).  Physical Exam  Constitutional: He is oriented to person, place, and time. He appears well-nourished. He appears distressed.  HENT:  Bilateral partial hearing deficits.  Eyes:  Corrective lens.  Neck: Normal range of motion. Neck supple. No JVD present. No tracheal deviation present. No thyromegaly present.  Cardiovascular: Normal rate, regular rhythm and normal heart sounds.  Exam reveals no gallop and no friction rub.   No murmur heard. Pacemaker left upper chest wall.  Pulmonary/Chest: Effort normal and breath sounds normal. No respiratory distress. He has no wheezes. He has no rales.  Abdominal: Soft. Bowel sounds are normal. He exhibits no distension and no mass. There is no tenderness.  Musculoskeletal: Normal range of motion. He exhibits edema. He exhibits no tenderness.  Lymphadenopathy:    He has no cervical adenopathy.  Neurological: He is alert and oriented to person, place, and time. No cranial nerve deficit. Coordination normal.  Absent vibratory and monofilament sensations 10/09/13 MMSE 27/30. Failed clock drawing.  Skin: Skin is warm and dry. No rash noted. No erythema. No pallor.  Psychiatric:  Sad. Speaks slowly at times.     Labs reviewed: Appointment on 10/07/2013  Component Date Value Ref Range Status  . Cholesterol, Total 10/07/2013 114  100 - 199 mg/dL Final  . Triglycerides 10/07/2013 98  0 - 149 mg/dL Final  . HDL 10/07/2013 44  >39 mg/dL Final   Comment: According to  ATP-III Guidelines, HDL-C >59 mg/dL is considered a                          negative risk factor for CHD.  Marland Kitchen VLDL Cholesterol Cal 10/07/2013 20  5 - 40 mg/dL Final  . LDL Calculated 10/07/2013 50  0 - 99 mg/dL Final  . Chol/HDL Ratio 10/07/2013 2.6  0.0 - 5.0 ratio units Final   Comment:                                   T. Chol/HDL Ratio  Men  Women                                                        1/2 Avg.Risk  3.4    3.3                                                            Avg.Risk  5.0    4.4                                                         2X Avg.Risk  9.6    7.1                                                         3X Avg.Risk 23.4   11.0  . Hemoglobin A1C 10/07/2013 6.0* 4.8 - 5.6 % Final   Comment:          Increased risk for diabetes: 5.7 - 6.4                                   Diabetes: >6.4                                   Glycemic control for adults with diabetes: <7.0  . Estimated average glucose 10/07/2013 126   Final  . Glucose 10/07/2013 132* 65 - 99 mg/dL Final  . BUN 10/07/2013 17  8 - 27 mg/dL Final  . Creatinine, Ser 10/07/2013 0.86  0.76 - 1.27 mg/dL Final  . GFR calc non Af Amer 10/07/2013 79  >59 mL/min/1.73 Final  . GFR calc Af Amer 10/07/2013 91  >59 mL/min/1.73 Final  . BUN/Creatinine Ratio 10/07/2013 20  10 - 22 Final  . Sodium 10/07/2013 144  134 - 144 mmol/L Final  . Potassium 10/07/2013 4.0  3.5 - 5.2 mmol/L Final  . Chloride 10/07/2013 105  97 - 108 mmol/L Final  . CO2 10/07/2013 25  18 - 29 mmol/L Final  . Calcium 10/07/2013 10.0  8.6 - 10.2 mg/dL Final  . Total Protein 10/07/2013 6.5  6.0 - 8.5 g/dL Final  . Albumin 10/07/2013 4.3  3.5 - 4.7 g/dL Final  . Globulin, Total 10/07/2013 2.2  1.5 - 4.5 g/dL Final  . Albumin/Globulin Ratio 10/07/2013 2.0  1.1 - 2.5 Final  . Total Bilirubin 10/07/2013 0.9  0.0 - 1.2 mg/dL Final  . Alkaline Phosphatase 10/07/2013 98  39 -  117 IU/L Final  . AST 10/07/2013 22  0 - 40 IU/L Final  . ALT 10/07/2013 18  0 - 44 IU/L Final      Assessment/Plan  1.  Diabetes mellitus without complication controlled - Hemoglobin A1c; Future - Microalbumin, urine; Future - Basic metabolic panel; Future  2. HYPERTENSION controlled - Basic metabolic panel; Future  3. HYPERLIPIDEMIA controlled  4. CORONARY ARTERY DISEASE  s/p CABG Rest chest pain; ? angina - nitroGLYCERIN (NITROSTAT) 0.4 MG SL tablet; Place 1 tablet (0.4 mg total) under the tongue every 5 (five) minutes as needed for chest pain.  Dispense: 25 tablet; Refill: 12 - Ambulatory referral to Cardiology  5. COPD stable  6. Alzheimer's disease Diagnosis is questionable. Memory loss may be more compatible with Vascular dementia. The progression has been very slow and is atypical for Alzheimer's.  7. Hypertrophy of prostate with urinary obstruction and other lower urinary tract symptoms (LUTS) May be related to incontinence. He denies increase in noicturia  8. Reflux esophagitis Denies heartburn but is experiencing mild dysphagia. Use omeprazole 40 mg qd.  9. Pain in joint, ankle and foot, right Mild in left ankle and forefoot. Wears  Brace on the right ankle.  10. Unspecified urinary incontinence -samples Vesicare 10 mg 1 qd (28)  11. Achalasia and cardiospasm Possible cause of recent chest pain  12. Cardiac pacemaker in situ Has not been checked recently. Last visit with cardiologist was Aug 2014, Dtr. Caryl Comes.

## 2013-10-09 NOTE — Patient Instructions (Signed)
Use nitroglycerin for chest pain

## 2013-10-09 NOTE — Progress Notes (Signed)
Failed clock drawing- sent for scanning

## 2013-10-11 ENCOUNTER — Ambulatory Visit (INDEPENDENT_AMBULATORY_CARE_PROVIDER_SITE_OTHER): Payer: Medicare Other | Admitting: Internal Medicine

## 2013-10-11 ENCOUNTER — Encounter: Payer: Self-pay | Admitting: Internal Medicine

## 2013-10-11 VITALS — BP 151/87 | HR 81 | Ht 72.0 in | Wt 182.0 lb

## 2013-10-11 DIAGNOSIS — R079 Chest pain, unspecified: Secondary | ICD-10-CM

## 2013-10-11 DIAGNOSIS — I495 Sick sinus syndrome: Secondary | ICD-10-CM

## 2013-10-11 DIAGNOSIS — Z950 Presence of cardiac pacemaker: Secondary | ICD-10-CM

## 2013-10-11 DIAGNOSIS — I2510 Atherosclerotic heart disease of native coronary artery without angina pectoris: Secondary | ICD-10-CM

## 2013-10-11 LAB — MDC_IDC_ENUM_SESS_TYPE_INCLINIC
Battery Impedance: 3071 Ohm
Battery Voltage: 2.71 V
Brady Statistic AP VS Percent: 82 %
Brady Statistic AS VS Percent: 17 %
Lead Channel Impedance Value: 619 Ohm
Lead Channel Pacing Threshold Amplitude: 0.75 V
Lead Channel Pacing Threshold Pulse Width: 0.4 ms
Lead Channel Pacing Threshold Pulse Width: 0.4 ms
Lead Channel Sensing Intrinsic Amplitude: 22.4 mV
Lead Channel Setting Pacing Amplitude: 1.5 V
Lead Channel Setting Pacing Amplitude: 2 V
Lead Channel Setting Sensing Sensitivity: 5.6 mV
MDC IDC MSMT BATTERY REMAINING LONGEVITY: 17 mo
MDC IDC MSMT LEADCHNL RA PACING THRESHOLD AMPLITUDE: 0.75 V
MDC IDC MSMT LEADCHNL RA SENSING INTR AMPL: 1.4 mV
MDC IDC MSMT LEADCHNL RV IMPEDANCE VALUE: 680 Ohm
MDC IDC SESS DTM: 20150717114801
MDC IDC SET LEADCHNL RV PACING PULSEWIDTH: 0.4 ms
MDC IDC STAT BRADY AP VP PERCENT: 0 %
MDC IDC STAT BRADY AS VP PERCENT: 0 %

## 2013-10-11 LAB — TROPONIN I: Troponin I: <0.3 ng/mL (ref <0.06)

## 2013-10-11 MED ORDER — NITROGLYCERIN 0.4 MG SL SUBL: 0.4000 mg | SUBLINGUAL_TABLET | SUBLINGUAL | Status: DC | PRN

## 2013-10-11 MED ORDER — CARVEDILOL 6.25 MG PO TABS
6.2500 mg | ORAL_TABLET | Freq: Two times a day (BID) | ORAL | Status: DC
Start: 2013-10-11 — End: 2014-11-06

## 2013-10-11 NOTE — Progress Notes (Signed)
Delila Spence      Patient Care Team: Estill Dooms, MD as PCP - General (Geriatric Medicine)   HPI  [facility] is a 80 y.o. male Seen in followup for pacemaker implanted for symptomatic bradycardia. He also has coronary artery disease with prior LAD stenting and bypass surgery. Previously seen by Dr.RA W. And [facility]ell Echocardiogram 8/11 demonstrated ejection fraction of about 50% with some motion abnormalities  He was seen by Dr. Nyoka Cowden a couple of days ago. He set up this appointment episode of severe chest discomfort 4-5 days ago. This lasted about 2 hours; he described it as a pressure-like sensation with radiation into his neck and accompanied by shortness of breath but no diaphoresis or nausea. It reminded him of his prior heart attack. He took some TUMS to have gradually abated. He has not had any residual discomfort or change in exercise tolerance.  Past Medical History  Diagnosis Date  . Allergy   . Ischemic cardiomyopathy s/p CABG 2004   . Anxiety   . Diabetes mellitus without complication   . Cataract   . Sinus node dysfunction   . GERD (gastroesophageal reflux disease)   . Hypertension   . Metatarsalgia of both feet   . Debility, unspecified   . Gout, unspecified   . Urinary frequency   . Sebaceous cyst   . Other seborrheic keratosis   . Unspecified nonsenile cataract   . Pain in joint, shoulder region   . Cervicalgia   . Memory loss 07/20/2005  . Irritable bowel syndrome   . Neoplasm of uncertain behavior of lip, oral cavity, and pharynx   . Macular degeneration (senile) of retina, unspecified   . Syncope and collapse   . Blastomycosis   . Peptic ulcer, unspecified site, unspecified as acute or chronic, without mention of hemorrhage, perforation, or obstruction   . Blood in stool   . Insomnia, unspecified   . Left bundle branch block   . Elevated prostate specific antigen (PSA)   . Lipoma of other skin and subcutaneous tissue   . Gastroparesis   . peripheral  neuropathy   . Lumbago   . Carpal tunnel syndrome   . Osteoarthrosis, unspecified whether generalized or localized, unspecified site   . Achalasia and cardiospasm   . Pacemaker-Medtronic   . Diverticulitis of colon (without mention of hemorrhage)   . CORONARY ARTERY DISEASE 05/27/2008    Qualifier: Diagnosis of  By: Edison Pace RN, Tammy      Past Surgical History  Procedure Laterality Date  . Cataract extraction    . Open heart surgery  12/1997    DR GAMBLE  . Eye surgery    . Vasectomy  1976  . Appendectomy  1995  . (r) ear implant      DR WELLS  . Nasal sinus surgery  1978    DR WELLS  . Shoulder arthroscopy w/ rotator cuff repair  1995    DR HARKINS  . Excise thrombosed hemorrhoid  07/1996    DR LEONE   . Minor hemorrhoidectomy  04/1998    DR LEONE   . Coronary artery bypass graft  01/2003    DR HENDRICKSON  . Pacemaker insertion  10/28/2004    DR MCQUEEN  . Nodules removed left arm  05/02/2006    DR GROSS    Current Outpatient Prescriptions  Medication Sig Dispense Refill  . aspirin 81 MG tablet Take 81 mg by mouth daily. Take 1 tablet daily to prevent heart attack, stroke and  blood clot.      . carvedilol (COREG) 3.125 MG tablet Take 3.125 mg by mouth 2 (two) times daily with a meal. Take 1 tablet by mouth 2 times daily for heart.      . Celecoxib (CELEBREX PO) Take by mouth daily.      . finasteride (PROSCAR) 5 MG tablet Take 5 mg by mouth daily. Tke one tablet once a day to help with prostate      . glucose blood (FREESTYLE LITE) test strip 1 each by Other route as needed for other. Use as instructed, test blood sugar twice daily.      . Lancets (FREESTYLE) lancets 1 each by Other route as needed for other. Use as instructed      . metformin (FORTAMET) 500 MG (OSM) 24 hr tablet Take one tablet once a day to control diabetes  90 tablet  3  . nitroGLYCERIN (NITROSTAT) 0.4 MG SL tablet Place 1 tablet (0.4 mg total) under the tongue every 5 (five) minutes as needed for chest  pain.  25 tablet  12  . omeprazole (PRILOSEC) 40 MG capsule One daily to reduce stomach acid and prevent ulcer.  30 capsule  3  . oxybutynin (DITROPAN) 5 MG tablet One up to 3 times daily to help bladder control  100 tablet  5  . polyethylene glycol (MIRALAX / GLYCOLAX) packet Take 17 g by mouth daily. Take 17 grams in 8 oz of water daily as needed for constipation.      . simvastatin (ZOCOR) 40 MG tablet Take one tablet by mouth once daily to lower cholesterol  90 tablet  3  . solifenacin (VESICARE) 10 MG tablet One daily to help control bladder  28 tablet  0  . tamsulosin (FLOMAX) 0.4 MG CAPS capsule Take 1 capsule by mouth once daily to help prostate  90 capsule  0  . zolpidem (AMBIEN) 10 MG tablet Take one tablet at bedtime for sleep  30 tablet  4   No current facility-administered medications for this visit.    Allergies  Allergen Reactions  . Altace [Ramipril]   . Aricept [Donepezil Hcl]   . Codeine     REACTION: ?  . Cymbalta [Duloxetine Hcl]   . Feldene [Piroxicam]   . Namenda [Memantine Hcl] Other (See Comments)    nightmares  . Ultram [Tramadol]     Review of Systems negative except from HPI and PMH  Physical Exam BP 151/87  Pulse 81  Ht 6' (1.829 m)  Wt 182 lb (82.555 kg)  BMI 24.68 kg/m2 Well developed and well nourished in no acute distress HENT normal E scleral and icterus clear Neck Supple JVP flat; carotids brisk and full Clear to ausculation  Regular rate and rhythm, no murmurs gallops or rub Soft with active bowel sounds No tenderness No clubbing cyanosis  Edema Alert and oriented, grossly normal motor and sensory function Skin Warm and Dry  ECG demonstrated atrial pacing at 80 Intervals 21/13/44 Left axis deviation Left bundle branch block   Assessment and  Plan  Ischemic heart disease s/p CABG    Prolonged chest pain  Sinus node dysfunction  Hypertension  Pacemaker-Medtronic The patient's device was interrogated and the information was  fully reviewed.  The device was reprogrammed to  to decrease the rate response  The patient's severe episode of chest discomfort is worrisome for myocardial infarction. With his left bundle branch block it is electrocardiographically hidden  We will check a troponin level and anticipate  admission if it is abnormal; it is normal we will plan Myoview scanning.  We will increase his carvedilol 3.125--6.25 mg twice daily. This will help also with blood pressure.

## 2013-10-11 NOTE — Patient Instructions (Addendum)
Your physician has recommended you make the following change in your medication:  1) INCREASE Carvedilol to 6.25 mg twice daily  Lab today: STAT Troponin  Your physician has requested that you have a lexiscan myoview (if troponin comes back negative). For further information please visit [email or web address]. Please follow instruction sheet, as given.  Your physician wants you to follow-up in: 6 months with device clinic.  You will receive a reminder letter in the mail two months in advance. If you don't receive a letter, please call our office to schedule the follow-up appointment.  Your physician wants you to follow-up in: 1 year with Dr. Caryl Comes.  You will receive a reminder letter in the mail two months in advance. If you don't receive a letter, please call our office to schedule the follow-up appointment.

## 2013-10-22 ENCOUNTER — Ambulatory Visit (HOSPITAL_COMMUNITY): Payer: Medicare Other

## 2013-10-25 ENCOUNTER — Other Ambulatory Visit: Payer: Self-pay | Admitting: *Deleted

## 2013-10-25 DIAGNOSIS — G4700 Insomnia, unspecified: Secondary | ICD-10-CM

## 2013-10-25 MED ORDER — ZOLPIDEM TARTRATE 10 MG PO TABS: ORAL_TABLET | ORAL | Status: DC

## 2013-10-25 NOTE — Telephone Encounter (Signed)
rx faxed to [facility] @ [phone].

## 2013-10-30 ENCOUNTER — Ambulatory Visit (HOSPITAL_COMMUNITY): Payer: Medicare Other | Attending: Cardiovascular Disease | Admitting: Radiology

## 2013-10-30 VITALS — BP 132/85 | HR 60 | Ht 72.0 in | Wt 184.0 lb

## 2013-10-30 DIAGNOSIS — R002 Palpitations: Secondary | ICD-10-CM | POA: Diagnosis not present

## 2013-10-30 DIAGNOSIS — I252 Old myocardial infarction: Secondary | ICD-10-CM | POA: Insufficient documentation

## 2013-10-30 DIAGNOSIS — I447 Left bundle-branch block, unspecified: Secondary | ICD-10-CM | POA: Insufficient documentation

## 2013-10-30 DIAGNOSIS — Z951 Presence of aortocoronary bypass graft: Secondary | ICD-10-CM | POA: Diagnosis not present

## 2013-10-30 DIAGNOSIS — R079 Chest pain, unspecified: Secondary | ICD-10-CM | POA: Diagnosis present

## 2013-10-30 DIAGNOSIS — I10 Essential (primary) hypertension: Secondary | ICD-10-CM | POA: Diagnosis not present

## 2013-10-30 DIAGNOSIS — R0602 Shortness of breath: Secondary | ICD-10-CM | POA: Diagnosis not present

## 2013-10-30 DIAGNOSIS — I2510 Atherosclerotic heart disease of native coronary artery without angina pectoris: Secondary | ICD-10-CM | POA: Diagnosis not present

## 2013-10-30 DIAGNOSIS — Z954 Presence of other heart-valve replacement: Secondary | ICD-10-CM | POA: Insufficient documentation

## 2013-10-30 DIAGNOSIS — R0989 Other specified symptoms and signs involving the circulatory and respiratory systems: Secondary | ICD-10-CM | POA: Insufficient documentation

## 2013-10-30 DIAGNOSIS — R42 Dizziness and giddiness: Secondary | ICD-10-CM | POA: Insufficient documentation

## 2013-10-30 DIAGNOSIS — R0609 Other forms of dyspnea: Secondary | ICD-10-CM | POA: Diagnosis not present

## 2013-10-30 MED ORDER — ADENOSINE (DIAGNOSTIC) 3 MG/ML IV SOLN
0.5600 mg/kg | Freq: Once | INTRAVENOUS | Status: AC
  Administered 2013-10-30: 46.8 mg via INTRAVENOUS

## 2013-10-30 MED ORDER — TECHNETIUM TC 99M SESTAMIBI GENERIC - CARDIOLITE
10.0000 | Freq: Once | INTRAVENOUS | Status: AC | PRN
  Administered 2013-10-30: 10 via INTRAVENOUS

## 2013-10-30 MED ORDER — TECHNETIUM TC 99M SESTAMIBI GENERIC - CARDIOLITE
30.0000 | Freq: Once | INTRAVENOUS | Status: AC | PRN
  Administered 2013-10-30: 30 via INTRAVENOUS

## 2013-10-30 NOTE — Progress Notes (Signed)
[facility] [address] [place], [place] [address] [phone]    Cardiology Nuclear Med Study  [address] [name] is a 80 y.o. male     MRN : [id number]     DOB: [birth year]  Procedure Date: 10/30/2013  Nuclear Med Background Indication for Stress Test:  Evaluation for Ischemia, Graft Patency, and Stent Patency History:  CAD, MI, CABG, Stent, PTVP, and 2013 Myocardial Perfusion Imaging-[facility], EF=65% Cardiac Risk Factors: Hypertension and LBBB  Symptoms: Chest Pain with/without exertion (last occurrence one month ago), Dizziness, DOE, Palpitations and SOB   Nuclear Pre-Procedure Caffeine/Decaff Intake:  None NPO After: 4 pm   Lungs:  clear O2 Sat: 97% on room air. IV 0.9% NS with Angio Cath:  22g  IV Site: R Hand  IV Started by:  Crissie Figures, RN  Chest Size (in):  42 Cup Size: n/a  Height: 6' (1.829 m)  Weight:  184 lb (83.462 kg)  BMI:  Body mass index is 24.95 kg/(m^2). Tech Comments:  N/A    Nuclear Med Study 1 or 2 day study: 1 day  Stress Test Type:  Adenosine  Reading MD: N/A  Order Authorizing Provider:  Jolyn Nap, MD  Resting Radionuclide: Technetium 69m Sestamibi  Resting Radionuclide Dose: 11.0 mCi   Stress Radionuclide:  Technetium 39m Sestamibi  Stress Radionuclide Dose: 33.0 mCi           Stress Protocol Rest HR: 60 Stress HR: 63  Rest BP: 132/85 Stress BP: 149/73  Exercise Time (min): n/a METS: n/a   Predicted Max HR: 135 bpm % Max HR: 46.67 bpm Rate Pressure Product: 9387   Dose of Adenosine (mg):  46.8 Dose of Lexiscan: n/a mg  Dose of Atropine (mg): n/a Dose of Dobutamine: n/a mcg/kg/min (at max HR)  Stress Test Technologist: Irven Baltimore, RN  Nuclear Technologist:  Vedia Pereyra, CNMT     Rest Procedure:  Myocardial perfusion imaging was performed at rest 45 minutes following the intravenous administration of Technetium 74m Sestamibi. Rest ECG: NSR-LBBB  Stress Procedure:  The patient received IV adenosine at  140 mcg/kg/min for 4 minutes. The patient complained of chest pain and SOB with adenosine. Technetium 72m Sestamibi was injected at the 2 minute mark and quantitative spect images were obtained after a 45 minute delay. Stress ECG: No significant change from baseline ECG  QPS Raw Data Images:  Normal; no motion artifact; normal heart/lung ratio. Stress Images:  Medium-sized, moderate perfusion defect involving all apical segments.  Rest Images:  Medium-sized, moderate perfusion defect involving all apical segments. Subtraction (SDS):  Fixed peri-apical defect. Transient Ischemic Dilatation (Normal <1.22):  1.05 Lung/Heart Ratio (Normal <0.45):  0.21  Quantitative Gated Spect Images QGS EDV:  103 ml QGS ESV:  54 ml  Impression Exercise Capacity:  Adenosine study with no exercise. BP Response:  Normal blood pressure response. Clinical Symptoms:  Dyspnea, chest pain ECG Impression:  Baseline:  LBBB.  EKG uninterpretable due to LBBB at rest and stress. Comparison with Prior Nuclear Study: No images to compare  Overall Impression:  Low risk stress nuclear study with a fixed peri-apical perfusion defect and no evidence for significant ischemia. Wall motion abnormality pattern matches the perfusion defects.  Suspect prior infarction. .  LV Ejection Fraction: 48%.  LV Wall Motion:  Peri-apical akinesis.   Dustin Davila 10/30/2013

## 2013-11-22 ENCOUNTER — Other Ambulatory Visit: Payer: Self-pay | Admitting: *Deleted

## 2013-11-22 DIAGNOSIS — G4700 Insomnia, unspecified: Secondary | ICD-10-CM

## 2013-11-22 MED ORDER — ZOLPIDEM TARTRATE 10 MG PO TABS: ORAL_TABLET | ORAL | Status: DC

## 2013-11-22 NOTE — Telephone Encounter (Signed)
[facility] 

## 2013-12-25 ENCOUNTER — Other Ambulatory Visit: Payer: Self-pay | Admitting: *Deleted

## 2013-12-25 DIAGNOSIS — G4700 Insomnia, unspecified: Secondary | ICD-10-CM

## 2013-12-25 MED ORDER — ZOLPIDEM TARTRATE 10 MG PO TABS: ORAL_TABLET | ORAL | Status: DC

## 2014-01-06 ENCOUNTER — Other Ambulatory Visit: Payer: Medicare Other

## 2014-01-07 ENCOUNTER — Other Ambulatory Visit: Payer: Self-pay | Admitting: *Deleted

## 2014-01-07 ENCOUNTER — Other Ambulatory Visit: Payer: Medicare Other

## 2014-01-07 DIAGNOSIS — G4700 Insomnia, unspecified: Secondary | ICD-10-CM

## 2014-01-07 DIAGNOSIS — E119 Type 2 diabetes mellitus without complications: Secondary | ICD-10-CM

## 2014-01-07 DIAGNOSIS — I10 Essential (primary) hypertension: Secondary | ICD-10-CM

## 2014-01-07 MED ORDER — ZOLPIDEM TARTRATE 10 MG PO TABS: ORAL_TABLET | ORAL | Status: DC

## 2014-01-07 NOTE — Telephone Encounter (Signed)
Patient walked in and requested written Rx to take to pharmacy

## 2014-01-08 ENCOUNTER — Encounter: Payer: Self-pay | Admitting: Internal Medicine

## 2014-01-08 ENCOUNTER — Ambulatory Visit (INDEPENDENT_AMBULATORY_CARE_PROVIDER_SITE_OTHER): Payer: Medicare Other | Admitting: Internal Medicine

## 2014-01-08 VITALS — BP 136/82 | HR 84 | Temp 98.0°F | Resp 10 | Ht 72.0 in | Wt 185.0 lb

## 2014-01-08 DIAGNOSIS — I10 Essential (primary) hypertension: Secondary | ICD-10-CM

## 2014-01-08 DIAGNOSIS — I255 Ischemic cardiomyopathy: Secondary | ICD-10-CM

## 2014-01-08 DIAGNOSIS — K220 Achalasia of cardia: Secondary | ICD-10-CM

## 2014-01-08 DIAGNOSIS — N138 Other obstructive and reflux uropathy: Secondary | ICD-10-CM

## 2014-01-08 DIAGNOSIS — K2100 Gastro-esophageal reflux disease with esophagitis, without bleeding: Secondary | ICD-10-CM

## 2014-01-08 DIAGNOSIS — R972 Elevated prostate specific antigen [PSA]: Secondary | ICD-10-CM

## 2014-01-08 DIAGNOSIS — E119 Type 2 diabetes mellitus without complications: Secondary | ICD-10-CM

## 2014-01-08 DIAGNOSIS — N401 Enlarged prostate with lower urinary tract symptoms: Secondary | ICD-10-CM

## 2014-01-08 DIAGNOSIS — Z23 Encounter for immunization: Secondary | ICD-10-CM

## 2014-01-08 LAB — BASIC METABOLIC PANEL
BUN / CREAT RATIO: 18 (ref 10–22)
BUN: 17 mg/dL (ref 8–27)
CHLORIDE: 106 mmol/L (ref 97–108)
CO2: 23 mmol/L (ref 18–29)
Calcium: 8.7 mg/dL (ref 8.6–10.2)
Creatinine, Ser: 0.94 mg/dL (ref 0.76–1.27)
GFR calc Af Amer: 85 mL/min/{1.73_m2} (ref >59)
GFR calc non Af Amer: 74 mL/min/{1.73_m2} (ref >59)
Glucose: 131 mg/dL — ABNORMAL HIGH (ref 65–99)
Potassium: 4.3 mmol/L (ref 3.5–5.2)
SODIUM: 145 mmol/L — AB (ref 134–144)

## 2014-01-08 LAB — MICROALBUMIN, URINE: Microalbumin, Urine: 18.6 ug/mL — ABNORMAL HIGH (ref 0.0–17.0)

## 2014-01-08 LAB — HEMOGLOBIN A1C
Est. average glucose Bld gHb Est-mCnc: 134 mg/dL
Hgb A1c MFr Bld: 6.3 % — ABNORMAL HIGH (ref 4.8–5.6)

## 2014-01-08 MED ORDER — TAMSULOSIN HCL 0.4 MG PO CAPS: ORAL_CAPSULE | ORAL | Status: DC

## 2014-01-08 MED ORDER — SOLIFENACIN SUCCINATE 10 MG PO TABS: ORAL_TABLET | ORAL | Status: DC

## 2014-01-08 NOTE — Patient Instructions (Signed)
You should be taking several medications to help your prostate and bladder control: finasteride, solifenacin, and tamsulosin

## 2014-01-15 ENCOUNTER — Other Ambulatory Visit (HOSPITAL_COMMUNITY): Payer: Medicare Other

## 2014-01-16 ENCOUNTER — Encounter (HOSPITAL_COMMUNITY): Payer: Self-pay | Admitting: Internal Medicine

## 2014-02-07 ENCOUNTER — Other Ambulatory Visit: Payer: Self-pay | Admitting: *Deleted

## 2014-02-07 DIAGNOSIS — G4700 Insomnia, unspecified: Secondary | ICD-10-CM

## 2014-02-07 MED ORDER — ZOLPIDEM TARTRATE 10 MG PO TABS: ORAL_TABLET | ORAL | Status: DC

## 2014-02-07 NOTE — Telephone Encounter (Signed)
[facility] called and requested refill.

## 2014-03-02 NOTE — Progress Notes (Signed)
Patient ID: Dustin Davila, male   DOB: [birth year], 80 y.o.   MRN: [id number]    Facility  [facility]    Place of Service:   OFFICE   Allergies  Allergen Reactions  . Altace [Ramipril]   . Aricept [Donepezil Hcl]   . Codeine     REACTION: ?  . Cymbalta [Duloxetine Hcl]   . Feldene [Piroxicam]   . Namenda [Memantine Hcl] Other (See Comments)    nightmares  . Ultram [Tramadol]     Chief Complaint  Patient presents with  . Medical Management of Chronic Issues    3 month follow-up, discuss labs (copy printed)   . Immunizations    Question if he can get flu and pneumonia vaccine today   . Urinary Frequency    Patient with urinary frequency (every 30 mintues) x 2 weeks or more . Patient not taking medications for bladder control (not sure why he is not taking)     HPI:  Diabetes mellitus without complication - controlled  Essential hypertension:  controlled  Reflux esophagitis: occasional acid reflux  Achalasia and cardiospasm: creates central chest discomfort  Elevated prostate specific antigen (PSA): needs follow up at a future visit  Cardiomyopathy, ischemic -  Needs follow-up echocardiogram. Other than easy fatigability, he seems to be compensated at this point.  BPH (benign prostatic hypertrophy) with urinary obstruction -  Likely cause of elevated PSA  Encounter for immunization: needs flu vaccine  Need for prophylactic vaccination with Streptococcus pneumoniae (Pneumococcus) and Influenza vaccines - Plan: Pneumococcal conjugate vaccine 13-valent    Medications: Patient's Medications  New Prescriptions   No medications on file  Previous Medications   ASPIRIN 81 MG TABLET    Take 81 mg by mouth daily. Take 1 tablet daily to prevent heart attack, stroke and blood clot.   CARVEDILOL (COREG) 6.25 MG TABLET    Take 1 tablet (6.25 mg total) by mouth 2 (two) times daily.   CELECOXIB (CELEBREX PO)    Take by mouth daily.   FINASTERIDE (PROSCAR) 5 MG TABLET    Take 5 mg  by mouth daily. Tke one tablet once a day to help with prostate   GLUCOSE BLOOD (FREESTYLE LITE) TEST STRIP    1 each by Other route as needed for other. Use as instructed, test blood sugar twice daily.   LANCETS (FREESTYLE) LANCETS    1 each by Other route as needed for other. Use as instructed   METFORMIN (FORTAMET) 500 MG (OSM) 24 HR TABLET    Take one tablet once a day to control diabetes   NITROGLYCERIN (NITROSTAT) 0.4 MG SL TABLET    Place 1 tablet (0.4 mg total) under the tongue every 5 (five) minutes as needed for chest pain.   OMEPRAZOLE (PRILOSEC) 40 MG CAPSULE    One daily to reduce stomach acid and prevent ulcer.   POLYETHYLENE GLYCOL (MIRALAX / GLYCOLAX) PACKET    Take 17 g by mouth daily. Take 17 grams in 8 oz of water daily as needed for constipation.   SIMVASTATIN (ZOCOR) 40 MG TABLET    Take one tablet by mouth once daily to lower cholesterol  Modified Medications   Modified Medication Previous Medication   SOLIFENACIN (VESICARE) 10 MG TABLET solifenacin (VESICARE) 10 MG tablet      One daily to help control bladder    One daily to help control bladder   TAMSULOSIN (FLOMAX) 0.4 MG CAPS CAPSULE tamsulosin (FLOMAX) 0.4 MG CAPS capsule  Take 1 capsule by mouth once daily to help prostate    Take 1 capsule by mouth once daily to help prostate   ZOLPIDEM (AMBIEN) 10 MG TABLET zolpidem (AMBIEN) 10 MG tablet      Take one tablet at bedtime for sleep    Take one tablet at bedtime for sleep  Discontinued Medications   OXYBUTYNIN (DITROPAN) 5 MG TABLET    One up to 3 times daily to help bladder control   ZOLPIDEM (AMBIEN) 10 MG TABLET    Take one tablet at bedtime for sleep     Review of Systems  Constitutional: Positive for activity change, appetite change and fatigue.  HENT: Positive for hearing loss.   Eyes: Negative.   Respiratory: Positive for chest tightness and shortness of breath.   Cardiovascular: Positive for chest pain and leg swelling. Negative for palpitations.        Pacemaker  Gastrointestinal:       Prior history includes intermittent diarrhea, intermittent constipation, heartburn and reflux, and occasional nausea. He has had some difficulty with swallowing in the past. Appetite is poor. He frequently feels full befor finishing a meal.  Endocrine: Negative.   Genitourinary:       Delays in starting urination. Hesitancy. Nocturia x2.  Musculoskeletal:       Arthritis. Shoulder pain and neck pains.  Skin: Negative.   Neurological:       Numb, especially in the right hand. Loss of sensation in the feet. Unsteady. Memory loss. History of confusion. Memory getting worse.  Hematological: Negative.   Psychiatric/Behavioral:       Insomnia. fatigued. Feeling sad most of the time. Worries about his demented wife who is in a nursing facility.  All other systems reviewed and are negative.   Filed Vitals:   01/08/14 1428  BP: 136/82  Pulse: 84  Temp: 98 F (36.7 C)  TempSrc: Oral  Resp: 10  Height: 6' (1.829 m)  Weight: 185 lb (83.915 kg)  SpO2: 98%   Body mass index is 25.08 kg/(m^2).  Physical Exam  Constitutional: He is oriented to person, place, and time. He appears well-nourished. He appears distressed.  HENT:  Bilateral partial hearing deficits.  Eyes:  Corrective lens.  Neck: Normal range of motion. Neck supple. No JVD present. No tracheal deviation present. No thyromegaly present.  Cardiovascular: Normal rate, regular rhythm and normal heart sounds.  Exam reveals no gallop and no friction rub.   No murmur heard. Pacemaker left upper chest wall.  Pulmonary/Chest: Effort normal and breath sounds normal. No respiratory distress. He has no wheezes. He has no rales.  Abdominal: Soft. Bowel sounds are normal. He exhibits no distension and no mass. There is no tenderness.  Musculoskeletal: Normal range of motion. He exhibits edema. He exhibits no tenderness.  Lymphadenopathy:    He has no cervical adenopathy.  Neurological: He is alert and  oriented to person, place, and time. No cranial nerve deficit. Coordination normal.  Absent vibratory and monofilament sensations 10/09/13 MMSE 27/30. Failed clock drawing.  Skin: Skin is warm and dry. No rash noted. No erythema. No pallor.  Psychiatric:  Sad. Speaks slowly at times.     Labs reviewed: Appointment on 01/07/2014  Component Date Value Ref Range Status  . Hgb A1c MFr Bld 01/07/2014 6.3* 4.8 - 5.6 % Final   Comment:          Increased risk for diabetes: 5.7 - 6.4  Diabetes: >6.4                                   Glycemic control for adults with diabetes: <7.0  . Est. average glucose Bld gHb Est-m* 01/07/2014 134   Final  . Microalbum.,U,Random 01/07/2014 18.6* 0.0 - 17.0 ug/mL Final  . Glucose 01/07/2014 131* 65 - 99 mg/dL Final  . BUN 01/07/2014 17  8 - 27 mg/dL Final  . Creatinine, Ser 01/07/2014 0.94  0.76 - 1.27 mg/dL Final  . GFR calc non Af Amer 01/07/2014 74  >59 mL/min/1.73 Final  . GFR calc Af Amer 01/07/2014 85  >59 mL/min/1.73 Final  . BUN/Creatinine Ratio 01/07/2014 18  10 - 22 Final  . Sodium 01/07/2014 145* 134 - 144 mmol/L Final  . Potassium 01/07/2014 4.3  3.5 - 5.2 mmol/L Final  . Chloride 01/07/2014 106  97 - 108 mmol/L Final  . CO2 01/07/2014 23  18 - 29 mmol/L Final  . Calcium 01/07/2014 8.7  8.6 - 10.2 mg/dL Final  Office Visit on 10/11/2013  Component Date Value Ref Range Status  . Troponin I 10/11/2013 <0.30  <0.06 ng/mL Final   Comment: No indication of myocardial injury.                                                     Due to the release kinetics of cTnI, a negative result within the                          first hours of the onset of symptoms does not rule out myocardial                          infarction with certainty. If myocardial infarction is still suspected                          repeat the test at appropriate intervals.  . Date Time Interrogation Session 10/11/2013 [id number]   Final  .  Pulse Generator Manufacturer 10/11/2013 [employer]   Final  . Pulse Gen Model 10/11/2013 [id number] EnPulse   Final  . Pulse Gen Serial Number 10/11/2013 [id number]   Final  . RV Sense Sensitivity 10/11/2013 5.6   Final  . RA Pace Amplitude 10/11/2013 1.5   Final  . RV Pace PulseWidth 10/11/2013 0.4   Final  . RV Pace Amplitude 10/11/2013 2   Final  . RA Impedance 10/11/2013 619   Final  . RA Amplitude 10/11/2013 1.4   Final  . RA Pacing Amplitude 10/11/2013 0.75   Final  . RA Pacing PulseWidth 10/11/2013 0.4   Final  . RV IMPEDANCE 10/11/2013 680   Final  . RV Amplitude 10/11/2013 22.4   Final  . RV Pacing Amplitude 10/11/2013 0.75   Final  . RV Pacing PulseWidth 10/11/2013 0.4   Final  . Battery Status 10/11/2013 Unknown   Final  . Battery Longevity 10/11/2013 17   Final  . Battery Voltage 10/11/2013 2.71   Final  . Battery Impedance 10/11/2013 3071   Final  . Loletha Grayer AP VP Percent 10/11/2013 0   Final  . Loletha Grayer AS VP Percent 10/11/2013 0  Final  Loletha Grayer AP VS Percent 10/11/2013 82   Final  . Brady AS VS Percent 10/11/2013 17   Final  . Eval Rhythm 10/11/2013 SB @59    Final  . Miscellaneous Comment 10/11/2013    Final                   Value:Pacemaker check in clinic. Normal device function. Thresholds, sensing, impedances consistent with previous measurements. Device programmed to maximize longevity. No mode switch or high ventricular rates noted. Device programmed at appropriate safety                          margins. Histogram distribution has 2nd small elevation---changed exertion response from 2 to 3. Device programmed to optimize intrinsic conduction. Estimated longevity 17 mo w/ range of <5-58mo. ROV w/ device clinic in 29mo & w/ SK in 57mo.     Assessment/Plan  1. Diabetes mellitus without complication Controlled   2. Essential hypertension controlled  3. Reflux esophagitis unchanged  4. Achalasia and cardiospasm unchanged  5. Elevated prostate specific antigen  (PSA)  6. Cardiomyopathy, ischemic - 2D Echocardiogram without contrast; Future  7. BPH (benign prostatic hypertrophy) with urinary obstruction - solifenacin (VESICARE) 10 MG tablet; One daily to help control bladder  Dispense: 90 tablet; Refill: 5 - tamsulosin (FLOMAX) 0.4 MG CAPS capsule; Take 1 capsule by mouth once daily to help prostate  Dispense: 90 capsule; Refill: 3  8. Encounter for immunization Flu vaccine  9. Need for prophylactic vaccination with Streptococcus pneumoniae (Pneumococcus) and Influenza vaccines - Pneumococcal conjugate vaccine 13-valent

## 2014-04-29 ENCOUNTER — Encounter: Payer: Self-pay | Admitting: Internal Medicine

## 2014-05-13 ENCOUNTER — Ambulatory Visit: Payer: Medicare Other | Admitting: Internal Medicine

## 2014-05-20 ENCOUNTER — Encounter: Payer: Self-pay | Admitting: Internal Medicine

## 2014-05-20 ENCOUNTER — Ambulatory Visit (INDEPENDENT_AMBULATORY_CARE_PROVIDER_SITE_OTHER): Payer: Medicare Other | Admitting: Internal Medicine

## 2014-05-20 VITALS — BP 142/84 | HR 73 | Temp 98.0°F | Resp 12 | Ht 72.0 in | Wt 182.0 lb

## 2014-05-20 DIAGNOSIS — E119 Type 2 diabetes mellitus without complications: Secondary | ICD-10-CM

## 2014-05-20 DIAGNOSIS — I10 Essential (primary) hypertension: Secondary | ICD-10-CM

## 2014-05-20 DIAGNOSIS — I255 Ischemic cardiomyopathy: Secondary | ICD-10-CM

## 2014-05-20 DIAGNOSIS — E785 Hyperlipidemia, unspecified: Secondary | ICD-10-CM

## 2014-05-20 DIAGNOSIS — Z950 Presence of cardiac pacemaker: Secondary | ICD-10-CM

## 2014-05-20 DIAGNOSIS — G309 Alzheimer's disease, unspecified: Secondary | ICD-10-CM

## 2014-05-20 DIAGNOSIS — G4700 Insomnia, unspecified: Secondary | ICD-10-CM

## 2014-05-20 DIAGNOSIS — F0280 Dementia in other diseases classified elsewhere without behavioral disturbance: Secondary | ICD-10-CM

## 2014-05-20 MED ORDER — ZOLPIDEM TARTRATE 10 MG PO TABS: ORAL_TABLET | ORAL | Status: DC

## 2014-05-20 NOTE — Progress Notes (Signed)
tient ID: Dustin Davila, male   DOB: 07-07-1926, 81 y.o.   MRN: [id number]    Facility  [facility]    Place of Service:   OFFICE   Allergies  Allergen Reactions  . Altace [Ramipril]   . Aricept [Donepezil Hcl]   . Codeine     REACTION: ?  . Cymbalta [Duloxetine Hcl]   . Feldene [Piroxicam]   . Namenda [Memantine Hcl] Other (See Comments)    nightmares  . Ultram [Tramadol]     Chief Complaint  Patient presents with  . Medical Management of Chronic Issues    4 month follow-up, complete SOAP form (MMSE complete 10/09/13, 27/30)  . Immunizations    Refused shingles vaccine     HPI:  Right ear has been uncomfortable.  Urinary urgency and occasional incontinence.  Insomnia - benefits from zolpidem (AMBIEN) 10 MG tablet  Alzheimer's disease: memory continues to decline  Cardiomyopathy, ischemic: denies dyspnea, chest pain, or palpitations  Diabetes mellitus without complication -controlled  Essential hypertension - controlled  Hyperlipidemia - controlled  Cardiac pacemaker in situ - Has not been checked recentluy    Medications: Patient's Medications  New Prescriptions   No medications on file  Previous Medications   ASPIRIN 81 MG TABLET    Take 81 mg by mouth daily. Take 1 tablet daily to prevent heart attack, stroke and blood clot.   CARVEDILOL (COREG) 6.25 MG TABLET    Take 1 tablet (6.25 mg total) by mouth 2 (two) times daily.   CELECOXIB (CELEBREX PO)    Take by mouth daily.   FINASTERIDE (PROSCAR) 5 MG TABLET    Take 5 mg by mouth daily. Tke one tablet once a day to help with prostate   GLUCOSE BLOOD (FREESTYLE LITE) TEST STRIP    1 each by Other route as needed for other. Use as instructed, test blood sugar twice daily.   LANCETS (FREESTYLE) LANCETS    1 each by Other route as needed for other. Use as instructed   METFORMIN (FORTAMET) 500 MG (OSM) 24 HR TABLET    Take one tablet once a day to control diabetes   NITROGLYCERIN (NITROSTAT) 0.4 MG SL TABLET     Place 1 tablet (0.4 mg total) under the tongue every 5 (five) minutes as needed for chest pain.   OMEPRAZOLE (PRILOSEC) 40 MG CAPSULE    One daily to reduce stomach acid and prevent ulcer.   POLYETHYLENE GLYCOL (MIRALAX / GLYCOLAX) PACKET    Take 17 g by mouth daily. Take 17 grams in 8 oz of water daily as needed for constipation.   SIMVASTATIN (ZOCOR) 40 MG TABLET    Take one tablet by mouth once daily to lower cholesterol   SOLIFENACIN (VESICARE) 10 MG TABLET    One daily to help control bladder   TAMSULOSIN (FLOMAX) 0.4 MG CAPS CAPSULE    Take 1 capsule by mouth once daily to help prostate  Modified Medications   Modified Medication Previous Medication   ZOLPIDEM (AMBIEN) 10 MG TABLET zolpidem (AMBIEN) 10 MG tablet      Take one tablet at bedtime for sleep    Take one tablet at bedtime for sleep  Discontinued Medications   No medications on file     Review of Systems  Constitutional: Positive for activity change, appetite change and fatigue.  HENT: Positive for hearing loss.   Eyes: Negative.   Respiratory: Positive for chest tightness and shortness of breath.   Cardiovascular: Positive for chest pain and  leg swelling. Negative for palpitations.       Pacemaker  Gastrointestinal:       Prior history includes intermittent diarrhea, intermittent constipation, heartburn and reflux, and occasional nausea. He has had some difficulty with swallowing in the past. Appetite is poor. He frequently feels full befor finishing a meal.  Endocrine: Negative.   Genitourinary:       Delays in starting urination. Hesitancy. Nocturia x2.  Musculoskeletal:       Arthritis. Shoulder pain and neck pains.  Skin: Negative.   Neurological:       Numb, especially in the right hand. Loss of sensation in the feet. Unsteady. Memory loss. History of confusion. Memory getting worse.  Hematological: Negative.   Psychiatric/Behavioral:       Insomnia. fatigued. Feeling sad most of the time. Worries about his  demented wife who is in a nursing facility.  All other systems reviewed and are negative.   Filed Vitals:   05/20/14 1457  BP: 142/84  Pulse: 73  Temp: 98 F (36.7 C)  TempSrc: Oral  Resp: 12  Height: 6' (1.829 m)  Weight: 182 lb (82.555 kg)  SpO2: 96%   Body mass index is 24.68 kg/(m^2).  Physical Exam  Constitutional: He is oriented to person, place, and time. He appears well-nourished. He appears distressed.  HENT:  Bilateral partial hearing deficits.  Eyes:  Corrective lens.  Neck: Normal range of motion. Neck supple. No JVD present. No tracheal deviation present. No thyromegaly present.  Cardiovascular: Normal rate, regular rhythm and normal heart sounds.  Exam reveals no gallop and no friction rub.   No murmur heard. Pacemaker left upper chest wall.  Pulmonary/Chest: Effort normal and breath sounds normal. No respiratory distress. He has no wheezes. He has no rales.  Abdominal: Soft. Bowel sounds are normal. He exhibits no distension and no mass. There is no tenderness.  Musculoskeletal: Normal range of motion. He exhibits edema. He exhibits no tenderness.  Lymphadenopathy:    He has no cervical adenopathy.  Neurological: He is alert and oriented to person, place, and time. No cranial nerve deficit. Coordination normal.  Absent vibratory and monofilament sensations 10/09/13 MMSE 27/30. Failed clock drawing.  Skin: Skin is warm and dry. No rash noted. No erythema. No pallor.  Psychiatric:  Sad. Speaks slowly at times.     Labs reviewed: No visits with results within 3 Month(s) from this visit. Latest known visit with results is:  Appointment on 01/07/2014  Component Date Value Ref Range Status  . Hgb A1c MFr Bld 01/07/2014 6.3* 4.8 - 5.6 % Final   Comment:          Increased risk for diabetes: 5.7 - 6.4                                   Diabetes: >6.4                                   Glycemic control for adults with diabetes: <7.0  . Est. average glucose Bld  gHb Est-m* 01/07/2014 134   Final  . Microalbum.,U,Random 01/07/2014 18.6* 0.0 - 17.0 ug/mL Final  . Glucose 01/07/2014 131* 65 - 99 mg/dL Final  . BUN 01/07/2014 17  8 - 27 mg/dL Final  . Creatinine, Ser 01/07/2014 0.94  0.76 - 1.27 mg/dL Final  . GFR  calc non Af Amer 01/07/2014 74  >59 mL/min/1.73 Final  . GFR calc Af Amer 01/07/2014 85  >59 mL/min/1.73 Final  . BUN/Creatinine Ratio 01/07/2014 18  10 - 22 Final  . Sodium 01/07/2014 145* 134 - 144 mmol/L Final  . Potassium 01/07/2014 4.3  3.5 - 5.2 mmol/L Final  . Chloride 01/07/2014 106  97 - 108 mmol/L Final  . CO2 01/07/2014 23  18 - 29 mmol/L Final  . Calcium 01/07/2014 8.7  8.6 - 10.2 mg/dL Final     Assessment/Plan  1. Insomnia - zolpidem (AMBIEN) 10 MG tablet; Take one tablet at bedtime for sleep  Dispense: 30 tablet; Refill: 2  2. Alzheimer's disease MMSE next visit  3. Cardiomyopathy, ischemic No signs of heart failure  4. Diabetes mellitus without complication - Hemoglobin A1c; Future - Basic metabolic panel; Future - Microalbumin, urine; Future  5. Essential hypertension - Basic metabolic panel; Future  6. Hyperlipidemia - Lipid panel; Future  7. Cardiac pacemaker in situ - EKG 12-Lead; Future

## 2014-07-22 ENCOUNTER — Other Ambulatory Visit: Payer: Self-pay

## 2014-07-22 MED ORDER — METFORMIN HCL ER (OSM) 500 MG PO TB24: ORAL_TABLET | ORAL | Status: DC

## 2014-07-23 ENCOUNTER — Telehealth: Payer: Self-pay | Admitting: Internal Medicine

## 2014-07-23 NOTE — Telephone Encounter (Signed)
07/23/14 left vm to sched recall/device noshow from Jan; ah

## 2014-08-08 ENCOUNTER — Other Ambulatory Visit: Payer: Self-pay | Admitting: Internal Medicine

## 2014-08-08 ENCOUNTER — Other Ambulatory Visit: Payer: Self-pay | Admitting: *Deleted

## 2014-08-08 DIAGNOSIS — G4700 Insomnia, unspecified: Secondary | ICD-10-CM

## 2014-08-08 MED ORDER — ZOLPIDEM TARTRATE 10 MG PO TABS: ORAL_TABLET | ORAL | Status: DC

## 2014-08-08 NOTE — Telephone Encounter (Signed)
[facility] 

## 2014-08-15 ENCOUNTER — Other Ambulatory Visit: Payer: Medicare Other

## 2014-08-19 ENCOUNTER — Ambulatory Visit: Payer: Medicare Other | Admitting: Internal Medicine

## 2014-08-19 ENCOUNTER — Encounter: Payer: Self-pay | Admitting: Internal Medicine

## 2014-08-19 DIAGNOSIS — Z0289 Encounter for other administrative examinations: Secondary | ICD-10-CM

## 2014-10-30 ENCOUNTER — Encounter: Payer: Self-pay | Admitting: Internal Medicine

## 2014-10-30 ENCOUNTER — Ambulatory Visit (INDEPENDENT_AMBULATORY_CARE_PROVIDER_SITE_OTHER): Payer: Medicare Other | Admitting: *Deleted

## 2014-10-30 DIAGNOSIS — I495 Sick sinus syndrome: Secondary | ICD-10-CM

## 2014-10-30 LAB — CUP PACEART INCLINIC DEVICE CHECK
Battery Remaining Longevity: 4 mo
Lead Channel Impedance Value: 662 Ohm
Lead Channel Pacing Threshold Amplitude: 2 V
Lead Channel Pacing Threshold Pulse Width: 0.4 ms
Lead Channel Sensing Intrinsic Amplitude: 22.4 mV
Lead Channel Setting Sensing Sensitivity: 5.6 mV
MDC IDC MSMT BATTERY IMPEDANCE: 7073 Ohm
MDC IDC MSMT BATTERY VOLTAGE: 2.59 V
MDC IDC MSMT LEADCHNL RA IMPEDANCE VALUE: 67 Ohm
MDC IDC SESS DTM: 20160804170938
MDC IDC SET LEADCHNL RV PACING AMPLITUDE: 2 V
MDC IDC SET LEADCHNL RV PACING PULSEWIDTH: 0.4 ms
MDC IDC STAT BRADY RV PERCENT PACED: 0 %

## 2014-10-30 NOTE — Progress Notes (Signed)
Pacemaker check in clinic. Normal device function. Sensing, impedances consistent with previous measurements. Device ERI 10/01/14- patient feeling very weak with VVI pacing- single chamber hysteresis turned on at 50bpm. No high ventricular rates noted. Device programmed at appropriate safety margins. Histogram distribution appropriate for patient activity level. Device programmed to optimize intrinsic conduction. ROV with SK 11-06-14 at 1:30pm.

## 2014-11-06 ENCOUNTER — Ambulatory Visit (INDEPENDENT_AMBULATORY_CARE_PROVIDER_SITE_OTHER): Payer: Medicare Other | Admitting: Internal Medicine

## 2014-11-06 ENCOUNTER — Encounter: Payer: Self-pay | Admitting: Internal Medicine

## 2014-11-06 VITALS — BP 124/84 | HR 64 | Ht 72.0 in | Wt 184.2 lb

## 2014-11-06 DIAGNOSIS — I495 Sick sinus syndrome: Secondary | ICD-10-CM | POA: Diagnosis not present

## 2014-11-06 DIAGNOSIS — Z45010 Encounter for checking and testing of cardiac pacemaker pulse generator [battery]: Secondary | ICD-10-CM

## 2014-11-06 MED ORDER — CARVEDILOL 3.125 MG PO TABS: 3.1250 mg | ORAL_TABLET | Freq: Two times a day (BID) | ORAL | Status: AC

## 2014-11-06 NOTE — Patient Instructions (Addendum)
Medication Instructions:  Your physician has recommended you make the following change in your medication:  1) DECREASE Carvedilol to 3.125 mg twice a day  Labwork: None ordered  Testing/Procedures: None ordered  Follow-Up: Your physician recommends that you schedule a follow-up appointment in: 3 months with Chanetta Marshall, NP.   Any Other Special Instructions Will Be Listed Below (If Applicable). Thank you for choosing [facility]!!

## 2014-11-06 NOTE — Progress Notes (Signed)
Dustin Davila      Patient Care Team: Estill Dooms, MD as PCP - General (Geriatric Medicine)   HPI  [facility]in [facility] is a 81 y.o. male Seen in followup for pacemaker implanted for symptomatic bradycardia. He also has coronary artery disease with prior LAD stenting and bypass surgery. Previously seen by Dr.RA W. And Crowell His device has reached ERI  About a month ago he was seen and was reprogrammed to VVI hysteresis of 50. While there were symptoms described on lipase are report, at this point he denies that. He has He has no different now from when he was feeling 2 months ago i.e. with his pacing.  He has some exercise intolerance and some chest discomfort. This is unchanged over the last year.  Echocardiogram 8/11 demonstrated ejection fraction of about 50% with some motion abnormalities Myoview 8/15 illustrated ejection fraction of 48% without active ischemia     Past Medical History  Diagnosis Date  . Allergy   . Ischemic cardiomyopathy s/p CABG 2004   . Anxiety   . Diabetes mellitus without complication   . Cataract   . Sinus node dysfunction   . GERD (gastroesophageal reflux disease)   . Hypertension   . Metatarsalgia of both feet   . Debility, unspecified   . Gout, unspecified   . Urinary frequency   . Sebaceous cyst   . Other seborrheic keratosis   . Unspecified nonsenile cataract   . Pain in joint, shoulder region   . Cervicalgia   . Memory loss 07/20/2005  . Irritable bowel syndrome   . Neoplasm of uncertain behavior of lip, oral cavity, and pharynx   . Macular degeneration (senile) of retina, unspecified   . Syncope and collapse   . Blastomycosis   . Peptic ulcer, unspecified site, unspecified as acute or chronic, without mention of hemorrhage, perforation, or obstruction   . Blood in stool   . Insomnia, unspecified   . Left bundle branch block   . Elevated prostate specific antigen (PSA)   . Lipoma of other skin and subcutaneous tissue   . Gastroparesis    . peripheral neuropathy   . Lumbago   . Carpal tunnel syndrome   . Osteoarthrosis, unspecified whether generalized or localized, unspecified site   . Achalasia and cardiospasm   . Pacemaker-Medtronic   . Diverticulitis of colon (without mention of hemorrhage)   . CORONARY ARTERY DISEASE 05/27/2008    Qualifier: Diagnosis of  By: Edison Pace RN, Tammy      Past Surgical History  Procedure Laterality Date  . Cataract extraction    . Open heart surgery  12/1997    DR GAMBLE  . Eye surgery    . Vasectomy  1976  . Appendectomy  1995  . (r) ear implant      DR WELLS  . Nasal sinus surgery  1978    DR WELLS  . Shoulder arthroscopy w/ rotator cuff repair  1995    DR HARKINS  . Excise thrombosed hemorrhoid  07/1996    DR LEONE   . Minor hemorrhoidectomy  04/1998    DR LEONE   . Coronary artery bypass graft  01/2003    DR HENDRICKSON  . Pacemaker insertion  10/28/2004    DR MCQUEEN  . Nodules removed left arm  05/02/2006    DR GROSS    Current Outpatient Prescriptions  Medication Sig Dispense Refill  . aspirin 81 MG tablet Take 81 mg by mouth daily. Take 1 tablet daily to  prevent heart attack, stroke and blood clot.    . carvedilol (COREG) 6.25 MG tablet Take 1 tablet (6.25 mg total) by mouth 2 (two) times daily. 60 tablet 5  . Celecoxib (CELEBREX PO) Take by mouth daily.    . finasteride (PROSCAR) 5 MG tablet Take 5 mg by mouth daily. Tke one tablet once a day to help with prostate    . glucose blood (FREESTYLE LITE) test strip 1 each by Other route as needed for other. Use as instructed, test blood sugar twice daily.    . Lancets (FREESTYLE) lancets 1 each by Other route as needed for other. Use as instructed    . metformin (FORTAMET) 500 MG (OSM) 24 hr tablet Take one tablet once a day to control diabetes 90 tablet 1  . nitroGLYCERIN (NITROSTAT) 0.4 MG SL tablet Place 1 tablet (0.4 mg total) under the tongue every 5 (five) minutes as needed for chest pain. 25 tablet 3  . omeprazole  (PRILOSEC) 40 MG capsule One daily to reduce stomach acid and prevent ulcer. (Patient not taking: Reported on 10/30/2014) 30 capsule 3  . polyethylene glycol (MIRALAX / GLYCOLAX) packet Take 17 g by mouth daily. Take 17 grams in 8 oz of water daily as needed for constipation.    . simvastatin (ZOCOR) 40 MG tablet TAKE 1 TABLET BY MOUTH ONCE DAILY TO LOWER CHOLESTEROL 90 tablet 2  . solifenacin (VESICARE) 10 MG tablet One daily to help control bladder (Patient not taking: Reported on 10/30/2014) 90 tablet 5  . tamsulosin (FLOMAX) 0.4 MG CAPS capsule Take 1 capsule by mouth once daily to help prostate (Patient not taking: Reported on 10/30/2014) 90 capsule 3  . zolpidem (AMBIEN) 10 MG tablet Take one tablet at bedtime for sleep 30 tablet 2   No current facility-administered medications for this visit.    Allergies  Allergen Reactions  . Altace [Ramipril]   . Aricept [Donepezil Hcl]   . Codeine     REACTION: ?  . Cymbalta [Duloxetine Hcl]   . Feldene [Piroxicam]   . Namenda [Memantine Hcl] Other (See Comments)    nightmares  . Ultram [Tramadol]     Review of Systems negative except from HPI and PMH  Physical Exam BP 124/84 mmHg  Pulse 64  Ht 6' (1.829 m)  Wt 184 lb 3.2 oz (83.553 kg)  BMI 24.98 kg/m2 Well developed and well nourished in no acute distress HENT normal E scleral and icterus clear Neck Supple JVP flat; carotids brisk and full Clear to ausculation  Regular rate and rhythm, no murmurs gallops or rub Soft with active bowel sounds No tenderness No clubbing cyanosis  Edema Alert and oriented, grossly normal motor and sensory function Skin Warm and Dry  ECG demonstrated sinus rhythm at 64 Intervals 19/14/44 Left axis deviation Left bundle branch block   Assessment and  Plan  Ischemic heart disease s/p CABG    Left bundle branch block  Sinus node dysfunction  Hypertension  Pacemaker-Medtronic The patient's device was interrogated and the information was fully  reviewed.    It is not clear that he has symptoms that were better when he was pacing. He is currently in VVI mode with a hysteresis rate of 50 allowing for intrinsic conduction. There is no ongoing ventricular pacing. We will review the situation 3 months and see how he was doing and make a decision at that time.  While he has left bundle branch block his LV function was nearly normal year ago;  hence, not withstanding his symptoms of exercise intolerance/CHF, it would be inappropriate to pursue CRT.  Blood pressure is well-controlled.  Chest pain is probably nonischemic given his negative Myoview

## 2014-11-19 ENCOUNTER — Other Ambulatory Visit: Payer: Self-pay

## 2014-11-19 DIAGNOSIS — G4700 Insomnia, unspecified: Secondary | ICD-10-CM

## 2014-11-19 MED ORDER — ZOLPIDEM TARTRATE 10 MG PO TABS: ORAL_TABLET | ORAL | Status: DC

## 2014-12-15 ENCOUNTER — Encounter: Payer: Self-pay | Admitting: Internal Medicine

## 2014-12-19 ENCOUNTER — Other Ambulatory Visit: Payer: Self-pay | Admitting: *Deleted

## 2014-12-19 DIAGNOSIS — G4700 Insomnia, unspecified: Secondary | ICD-10-CM

## 2014-12-19 MED ORDER — ZOLPIDEM TARTRATE 10 MG PO TABS: ORAL_TABLET | ORAL | Status: DC

## 2014-12-19 NOTE — Telephone Encounter (Signed)
[facility] 

## 2015-01-02 ENCOUNTER — Encounter: Payer: Self-pay | Admitting: Internal Medicine

## 2015-01-05 ENCOUNTER — Other Ambulatory Visit: Payer: Self-pay | Admitting: Internal Medicine

## 2015-01-12 ENCOUNTER — Other Ambulatory Visit: Payer: Self-pay | Admitting: Internal Medicine

## 2015-01-13 ENCOUNTER — Other Ambulatory Visit: Payer: Self-pay | Admitting: Internal Medicine

## 2015-02-04 ENCOUNTER — Other Ambulatory Visit: Payer: Self-pay | Admitting: Internal Medicine

## 2015-02-06 ENCOUNTER — Other Ambulatory Visit: Payer: Self-pay | Admitting: Internal Medicine

## 2015-02-09 ENCOUNTER — Encounter: Payer: Medicare Other | Admitting: Nurse Practitioner

## 2015-02-12 ENCOUNTER — Other Ambulatory Visit: Payer: Self-pay | Admitting: Internal Medicine

## 2015-02-22 NOTE — Progress Notes (Signed)
Electrophysiology Office Note Date: 02/23/2015  ID:  Dustin Davila, DOB 05-02-1926, MRN [id number]  PCP: Estill Dooms, MD Electrophysiologist: Caryl Comes  CC: Pacemaker follow-up  Dustin Davila is a 81 y.o. male seen today for Dr Caryl Comes.  His pacemaker has been found to be at [facility] and his device was reprogrammed to VVI 50 with the plan to follow up to evaluate need for pacemaker generator change.  Previous device interrogations demonstrated >80% atrial pacing with a lower rate of 60.  He originally presented in 2006 with symptomatic bradycardia and rates in the 40's associated with fatigue and weakness. He has persistent but stable exercise intolerance.  He has developed increased gait unsteadiness as well as falling since device reached ERI.  Review of previous device interrogations demonstrate a relatively flat histogram with most atrial pacing at 60. V pacing percentage since reaching ERI not available - device stops recording diagnostics at ERI.   He denies chest pain, dyspnea, PND, orthopnea, nausea, vomiting, syncope, edema, weight gain, or early satiety.  He does report persistent neuropathic pain in feet.  Device History: MDT dual chamber PPM implanted 2006 for symptomatic bradycardia    Past Medical History  Diagnosis Date  . Allergy   . Ischemic cardiomyopathy s/p CABG 2004   . Anxiety   . Diabetes mellitus without complication ([place])   . Cataract   . Sinus node dysfunction (HCC)   . GERD (gastroesophageal reflux disease)   . Hypertension   . Metatarsalgia of both feet   . Debility, unspecified   . Gout, unspecified   . Urinary frequency   . Sebaceous cyst   . Other seborrheic keratosis   . Unspecified nonsenile cataract   . Pain in joint, shoulder region   . Cervicalgia   . Memory loss 07/20/2005  . Irritable bowel syndrome   . Neoplasm of uncertain behavior of lip, oral cavity, and pharynx   . Macular degeneration (senile) of retina, unspecified   . Syncope and  collapse   . Blastomycosis   . Peptic ulcer, unspecified site, unspecified as acute or chronic, without mention of hemorrhage, perforation, or obstruction   . Blood in stool   . Insomnia, unspecified   . Left bundle branch block   . Elevated prostate specific antigen (PSA)   . Lipoma of other skin and subcutaneous tissue   . Gastroparesis   . peripheral neuropathy   . Lumbago   . Carpal tunnel syndrome   . Osteoarthrosis, unspecified whether generalized or localized, unspecified site   . Achalasia and cardiospasm   . Pacemaker-Medtronic   . Diverticulitis of colon (without mention of hemorrhage)   . CORONARY ARTERY DISEASE 05/27/2008    Qualifier: Diagnosis of  By: Edison Pace RN, Tammy     Past Surgical History  Procedure Laterality Date  . Cataract extraction    . Open heart surgery  12/1997    DR GAMBLE  . Eye surgery    . Vasectomy  1976  . Appendectomy  1995  . (r) ear implant      DR WELLS  . Nasal sinus surgery  1978    DR WELLS  . Shoulder arthroscopy w/ rotator cuff repair  1995    DR HARKINS  . Excise thrombosed hemorrhoid  07/1996    DR LEONE   . Minor hemorrhoidectomy  04/1998    DR LEONE   . Coronary artery bypass graft  01/2003    DR HENDRICKSON  . Pacemaker insertion  10/28/2004  DR Tami Ribas  . Nodules removed left arm  05/02/2006    DR GROSS    Current Outpatient Prescriptions  Medication Sig Dispense Refill  . carvedilol (COREG) 3.125 MG tablet Take 1 tablet (3.125 mg total) by mouth 2 (two) times daily. 60 tablet 3  . finasteride (PROSCAR) 5 MG tablet Take 5 mg by mouth daily. Tke one tablet once a day to help with prostate    . glucose blood (FREESTYLE LITE) test strip 1 each by Other route as needed for other. Use as instructed, test blood sugar twice daily.    Marland Kitchen ibuprofen (ADVIL,MOTRIN) 200 MG tablet Take 200 mg by mouth at bedtime.    . Lancets (FREESTYLE) lancets 1 each by Other route as needed for other. Use as instructed    . metformin (FORTAMET)  500 MG (OSM) 24 hr tablet Take 500 mg by mouth daily with breakfast. To help control diabetes    . nitroGLYCERIN (NITROSTAT) 0.4 MG SL tablet Place 1 tablet (0.4 mg total) under the tongue every 5 (five) minutes as needed for chest pain. 25 tablet 3  . omeprazole (PRILOSEC) 40 MG capsule Take 40 mg by mouth daily.    . polyethylene glycol (MIRALAX / GLYCOLAX) packet Take 17 grams in 8 oz of water daily as needed for constipation.    . simvastatin (ZOCOR) 40 MG tablet TAKE 1 TABLET BY MOUTH ONCE DAILY TO LOWER CHOLESTEROL 90 tablet 1  . solifenacin (VESICARE) 10 MG tablet Take 10 mg by mouth daily.    . tamsulosin (FLOMAX) 0.4 MG CAPS capsule TAKE 1 CAPSULE BY MOUTH ONCE DAILY TO HELP PROSTATE 90 capsule 0  . zolpidem (AMBIEN) 10 MG tablet TAKE 1 TABLET BY MOUTH AT BEDTIME FOR SLEEP 30 tablet 0   No current facility-administered medications for this visit.    Allergies:   Altace; Aricept; Codeine; Cymbalta; Feldene; Namenda; and Ultram   Social History: Social History   Social History  . Marital Status: Single    Spouse Name: N/A  . Number of Children: N/A  . Years of Education: N/A   Occupational History  . Not on file.   Social History Main Topics  . Smoking status: Former Smoker    Types: Cigarettes    Quit date: 03/28/1938  . Smokeless tobacco: Never Used  . Alcohol Use: No  . Drug Use: No  . Sexual Activity: No   Other Topics Concern  . Not on file   Social History Narrative   Wife died of Alzheimer's on Feb 02, 2014.    Family History: Family History  Problem Relation Age of Onset  . Cancer Mother   . Heart disease Father   . Cancer Sister     breast  . Arthritis Brother   . Kidney disease Brother     Ureterolithiasis  . Heart disease Sister     CAD and stent     Review of Systems: All other systems reviewed and are otherwise negative except as noted above.   Physical Exam: VS:  BP 150/70 mmHg  Pulse 61  Ht 6' (1.829 m)  Wt 181 lb 9.6 oz (82.373 kg)   BMI 24.62 kg/m2  SpO2 97% , BMI Body mass index is 24.62 kg/(m^2).  GEN- The patient is elderly appearing, alert and oriented x 3 today, HOH HEENT: normocephalic, atraumatic; sclera clear, conjunctiva pink; hearing intact; oropharynx clear; neck supple  Lungs- Clear to ausculation bilaterally, normal work of breathing.  No wheezes, rales, rhonchi Heart- Regular rate and  rhythm  GI- soft, non-tender, non-distended, bowel sounds present  Extremities- no clubbing, cyanosis, or edema; DP/PT/radial pulses 2+ bilaterally MS- no significant deformity or atrophy Skin- warm and dry, no rash or lesion; PPM pocket well healed Psych- euthymic mood, full affect Neuro- strength and sensation are intact  PPM Interrogation- reviewed in detail today,  See PACEART report  EKG:  EKG is ordered today. The ekg ordered today shows sinus rhythm, rate 61, LBBB  Recent Labs: No results found for requested labs within last 365 days.   Wt Readings from Last 3 Encounters:  02/23/15 181 lb 9.6 oz (82.373 kg)  11/06/14 184 lb 3.2 oz (83.553 kg)  05/20/14 182 lb (82.555 kg)     Other studies Reviewed: Additional studies/ records that were reviewed today include: Dr Olin Pia office notes  Assessment and Plan:  1.  Symptomatic bradycardia Pacemaker at [facility]  He has developed increasing dizziness and falls since device reaching ERI. Device diagnostics not available at this time. Pacemaker generator change recommended. Risks, benefits discussed with patient who wishes to proceed. Will schedule with Dr Caryl Comes at the next available time.     2.  CAD s/p CABG No recent ischemic symptoms  Continue BB  3.  HTN Stable No change required today  4.  Peripheral neuropathy The patient reports persistent peripheral neuropathy. He is unaware of results of last A1C. Advised to follow up with PCP for treatment options.    Current medicines are reviewed at length with the patient today.   The patient does not have  concerns regarding his medicines.  The following changes were made today:  none  Labs/ tests ordered today include: BMET, CBC   Disposition:   Follow up with Dr Caryl Comes after generator change   Signed, Chanetta Marshall, NP 02/23/2015 9:40 AM  [place] [address] [phone] (office) [phone] (fax)

## 2015-02-23 ENCOUNTER — Encounter: Payer: Self-pay | Admitting: *Deleted

## 2015-02-23 ENCOUNTER — Encounter: Payer: Self-pay | Admitting: Nurse Practitioner

## 2015-02-23 ENCOUNTER — Ambulatory Visit (INDEPENDENT_AMBULATORY_CARE_PROVIDER_SITE_OTHER): Payer: Medicare Other | Admitting: Nurse Practitioner

## 2015-02-23 VITALS — BP 150/70 | HR 61 | Ht 72.0 in | Wt 181.6 lb

## 2015-02-23 DIAGNOSIS — R001 Bradycardia, unspecified: Secondary | ICD-10-CM | POA: Diagnosis not present

## 2015-02-23 DIAGNOSIS — I25810 Atherosclerosis of coronary artery bypass graft(s) without angina pectoris: Secondary | ICD-10-CM

## 2015-02-23 DIAGNOSIS — I10 Essential (primary) hypertension: Secondary | ICD-10-CM | POA: Diagnosis not present

## 2015-02-23 LAB — BASIC METABOLIC PANEL
BUN: 21 mg/dL (ref 7–25)
CALCIUM: 8.7 mg/dL (ref 8.6–10.3)
CO2: 26 mmol/L (ref 20–31)
CREATININE: 0.82 mg/dL (ref 0.70–1.11)
Chloride: 106 mmol/L (ref 98–110)
GLUCOSE: 108 mg/dL — AB (ref 65–99)
Potassium: 4 mmol/L (ref 3.5–5.3)
SODIUM: 142 mmol/L (ref 135–146)

## 2015-02-23 LAB — CBC
HCT: 41.6 % (ref 39.0–52.0)
HEMOGLOBIN: 14.6 g/dL (ref 13.0–17.0)
MCH: 30.9 pg (ref 26.0–34.0)
MCHC: 35.1 g/dL (ref 30.0–36.0)
MCV: 87.9 fL (ref 78.0–100.0)
MPV: 10.4 fL (ref 8.6–12.4)
Platelets: 146 10*3/uL — ABNORMAL LOW (ref 150–400)
RBC: 4.73 MIL/uL (ref 4.22–5.81)
RDW: 14 % (ref 11.5–15.5)
WBC: 6 10*3/uL (ref 4.0–10.5)

## 2015-02-23 NOTE — Patient Instructions (Signed)
Medication Instructions:    CONTINUE SAME MEDICATIONS     If you need a refill on your cardiac medications before your next appointment, please call your pharmacy.  Labwork:  BMET AND CBC   Testing/Procedures:  SEE LETTER FOR  03/09/15  PACER  BATTERY CHANGE INSTRUCTIONS   Follow-Up:  [place] WITH [facility]  03/18/15   91 DAY PHY PACER CK  WEEK June 10 2015    Any Other Special Instructions Will Be Listed Below (If Applicable).

## 2015-03-02 ENCOUNTER — Encounter (HOSPITAL_COMMUNITY): Payer: Self-pay | Admitting: Pharmacy Technician

## 2015-03-08 MED ORDER — SODIUM CHLORIDE 0.9 % IR SOLN
80.0000 mg | Status: AC
  Administered 2015-03-09: 80 mg
  Filled 2015-03-08: qty 2

## 2015-03-08 MED ORDER — CEFAZOLIN SODIUM-DEXTROSE 2-3 GM-% IV SOLR
2.0000 g | INTRAVENOUS | Status: AC
  Administered 2015-03-09: 2 g via INTRAVENOUS

## 2015-03-09 ENCOUNTER — Encounter (HOSPITAL_COMMUNITY)
Admission: RE | Disposition: A | Discharge status: Home / Self Care | Payer: Self-pay | Source: Ambulatory Visit | Attending: Internal Medicine

## 2015-03-09 ENCOUNTER — Encounter (HOSPITAL_COMMUNITY): Payer: Self-pay | Admitting: Internal Medicine

## 2015-03-09 ENCOUNTER — Ambulatory Visit (HOSPITAL_COMMUNITY)
Admission: RE | Admit: 2015-03-09 | Discharge: 2015-03-09 | Disposition: A | Discharge status: Home / Self Care | Payer: Medicare Other | Source: Ambulatory Visit | Attending: Internal Medicine | Admitting: Internal Medicine

## 2015-03-09 DIAGNOSIS — I255 Ischemic cardiomyopathy: Secondary | ICD-10-CM | POA: Diagnosis not present

## 2015-03-09 DIAGNOSIS — Z45010 Encounter for checking and testing of cardiac pacemaker pulse generator [battery]: Secondary | ICD-10-CM | POA: Insufficient documentation

## 2015-03-09 DIAGNOSIS — Z951 Presence of aortocoronary bypass graft: Secondary | ICD-10-CM | POA: Insufficient documentation

## 2015-03-09 DIAGNOSIS — I447 Left bundle-branch block, unspecified: Secondary | ICD-10-CM | POA: Diagnosis not present

## 2015-03-09 DIAGNOSIS — I2510 Atherosclerotic heart disease of native coronary artery without angina pectoris: Secondary | ICD-10-CM | POA: Insufficient documentation

## 2015-03-09 DIAGNOSIS — K219 Gastro-esophageal reflux disease without esophagitis: Secondary | ICD-10-CM | POA: Diagnosis not present

## 2015-03-09 DIAGNOSIS — I10 Essential (primary) hypertension: Secondary | ICD-10-CM | POA: Insufficient documentation

## 2015-03-09 DIAGNOSIS — F419 Anxiety disorder, unspecified: Secondary | ICD-10-CM | POA: Diagnosis not present

## 2015-03-09 DIAGNOSIS — G4700 Insomnia, unspecified: Secondary | ICD-10-CM | POA: Insufficient documentation

## 2015-03-09 DIAGNOSIS — M109 Gout, unspecified: Secondary | ICD-10-CM | POA: Diagnosis not present

## 2015-03-09 DIAGNOSIS — I495 Sick sinus syndrome: Secondary | ICD-10-CM | POA: Diagnosis present

## 2015-03-09 DIAGNOSIS — M1990 Unspecified osteoarthritis, unspecified site: Secondary | ICD-10-CM | POA: Diagnosis not present

## 2015-03-09 DIAGNOSIS — K3184 Gastroparesis: Secondary | ICD-10-CM | POA: Diagnosis not present

## 2015-03-09 DIAGNOSIS — Z7984 Long term (current) use of oral hypoglycemic drugs: Secondary | ICD-10-CM | POA: Diagnosis not present

## 2015-03-09 DIAGNOSIS — Z8249 Family history of ischemic heart disease and other diseases of the circulatory system: Secondary | ICD-10-CM | POA: Insufficient documentation

## 2015-03-09 DIAGNOSIS — E1142 Type 2 diabetes mellitus with diabetic polyneuropathy: Secondary | ICD-10-CM | POA: Diagnosis not present

## 2015-03-09 DIAGNOSIS — E1143 Type 2 diabetes mellitus with diabetic autonomic (poly)neuropathy: Secondary | ICD-10-CM | POA: Insufficient documentation

## 2015-03-09 DIAGNOSIS — Z87891 Personal history of nicotine dependence: Secondary | ICD-10-CM | POA: Insufficient documentation

## 2015-03-09 DIAGNOSIS — Z950 Presence of cardiac pacemaker: Secondary | ICD-10-CM | POA: Diagnosis present

## 2015-03-09 HISTORY — PX: EP IMPLANTABLE DEVICE: SHX172B

## 2015-03-09 LAB — SURGICAL PCR SCREEN
MRSA, PCR: NEGATIVE
Staphylococcus aureus: NEGATIVE

## 2015-03-09 LAB — GLUCOSE, CAPILLARY: GLUCOSE-CAPILLARY: 111 mg/dL — AB (ref 65–99)

## 2015-03-09 SURGERY — PPM/BIV PPM GENERATOR CHANGEOUT

## 2015-03-09 MED ORDER — LIDOCAINE HCL (PF) 1 % IJ SOLN
INTRAMUSCULAR | Status: AC
Start: 2015-03-09 — End: 2015-03-09
  Filled 2015-03-09: qty 30

## 2015-03-09 MED ORDER — LIDOCAINE HCL (PF) 1 % IJ SOLN
INTRAMUSCULAR | Status: AC
  Filled 2015-03-09: qty 30

## 2015-03-09 MED ORDER — FENTANYL CITRATE (PF) 100 MCG/2ML IJ SOLN
INTRAMUSCULAR | Status: AC
  Filled 2015-03-09: qty 2

## 2015-03-09 MED ORDER — MIDAZOLAM HCL 5 MG/5ML IJ SOLN
INTRAMUSCULAR | Status: AC
  Filled 2015-03-09: qty 5

## 2015-03-09 MED ORDER — CHLORHEXIDINE GLUCONATE 4 % EX LIQD
60.0000 mL | Freq: Once | CUTANEOUS | Status: DC
  Filled 2015-03-09: qty 60

## 2015-03-09 MED ORDER — MUPIROCIN 2 % EX OINT
1.0000 "application " | TOPICAL_OINTMENT | Freq: Once | CUTANEOUS | Status: DC
  Filled 2015-03-09: qty 22

## 2015-03-09 MED ORDER — SODIUM CHLORIDE 0.9 % IR SOLN
Status: AC
  Filled 2015-03-09: qty 2

## 2015-03-09 MED ORDER — MUPIROCIN 2 % EX OINT
TOPICAL_OINTMENT | CUTANEOUS | Status: AC
  Administered 2015-03-09: 1
  Filled 2015-03-09: qty 22

## 2015-03-09 MED ORDER — SODIUM CHLORIDE 0.9 % IV SOLN
INTRAVENOUS | Status: DC
  Administered 2015-03-09: 09:00:00 via INTRAVENOUS

## 2015-03-09 MED ORDER — LIDOCAINE HCL (PF) 1 % IJ SOLN
INTRAMUSCULAR | Status: DC | PRN
  Administered 2015-03-09: 31 mL

## 2015-03-09 MED ORDER — CEFAZOLIN SODIUM-DEXTROSE 2-3 GM-% IV SOLR
INTRAVENOUS | Status: AC
  Filled 2015-03-09: qty 50

## 2015-03-09 SURGICAL SUPPLY — 6 items
CABLE SURGICAL S-101-97-12 (CABLE) ×3 IMPLANT
HEMOSTAT SURGICEL 2X4 FIBR (HEMOSTASIS) ×3 IMPLANT
PACEMAKER ADAPTA DR ADDRL1 (Pacemaker) ×1 IMPLANT
PAD DEFIB LIFELINK (PAD) ×3 IMPLANT
PPM ADAPTA DR ADDRL1 (Pacemaker) ×3 IMPLANT
TRAY PACEMAKER INSERTION (PACKS) ×3 IMPLANT

## 2015-03-09 NOTE — H&P (View-Only) (Signed)
Electrophysiology Office Note Date: 02/23/2015  ID:  Dustin Davila, DOB 1926-12-11, MRN [id number]  PCP: Estill Dooms, MD Electrophysiologist: Caryl Comes  CC: Pacemaker follow-up  Dustin Davila is a 81 y.o. male seen today for Dr Caryl Comes.  His pacemaker has been found to be at [facility] and his device was reprogrammed to VVI 50 with the plan to follow up to evaluate need for pacemaker generator change.  Previous device interrogations demonstrated >80% atrial pacing with a lower rate of 60.  He originally presented in 2006 with symptomatic bradycardia and rates in the 40's associated with fatigue and weakness. He has persistent but stable exercise intolerance.  He has developed increased gait unsteadiness as well as falling since device reached ERI.  Review of previous device interrogations demonstrate a relatively flat histogram with most atrial pacing at 60. V pacing percentage since reaching ERI not available - device stops recording diagnostics at ERI.   He denies chest pain, dyspnea, PND, orthopnea, nausea, vomiting, syncope, edema, weight gain, or early satiety.  He does report persistent neuropathic pain in feet.  Device History: MDT dual chamber PPM implanted 2006 for symptomatic bradycardia    Past Medical History  Diagnosis Date  . Allergy   . Ischemic cardiomyopathy s/p CABG 2004   . Anxiety   . Diabetes mellitus without complication ([place])   . Cataract   . Sinus node dysfunction (HCC)   . GERD (gastroesophageal reflux disease)   . Hypertension   . Metatarsalgia of both feet   . Debility, unspecified   . Gout, unspecified   . Urinary frequency   . Sebaceous cyst   . Other seborrheic keratosis   . Unspecified nonsenile cataract   . Pain in joint, shoulder region   . Cervicalgia   . Memory loss 07/20/2005  . Irritable bowel syndrome   . Neoplasm of uncertain behavior of lip, oral cavity, and pharynx   . Macular degeneration (senile) of retina, unspecified   . Syncope and  collapse   . Blastomycosis   . Peptic ulcer, unspecified site, unspecified as acute or chronic, without mention of hemorrhage, perforation, or obstruction   . Blood in stool   . Insomnia, unspecified   . Left bundle branch block   . Elevated prostate specific antigen (PSA)   . Lipoma of other skin and subcutaneous tissue   . Gastroparesis   . peripheral neuropathy   . Lumbago   . Carpal tunnel syndrome   . Osteoarthrosis, unspecified whether generalized or localized, unspecified site   . Achalasia and cardiospasm   . Pacemaker-Medtronic   . Diverticulitis of colon (without mention of hemorrhage)   . CORONARY ARTERY DISEASE 05/27/2008    Qualifier: Diagnosis of  By: Edison Pace RN, Tammy     Past Surgical History  Procedure Laterality Date  . Cataract extraction    . Open heart surgery  12/1997    DR GAMBLE  . Eye surgery    . Vasectomy  1976  . Appendectomy  1995  . (r) ear implant      DR WELLS  . Nasal sinus surgery  1978    DR WELLS  . Shoulder arthroscopy w/ rotator cuff repair  1995    DR HARKINS  . Excise thrombosed hemorrhoid  07/1996    DR LEONE   . Minor hemorrhoidectomy  04/1998    DR LEONE   . Coronary artery bypass graft  01/2003    DR HENDRICKSON  . Pacemaker insertion  10/28/2004  DR Tami Ribas  . Nodules removed left arm  05/02/2006    DR GROSS    Current Outpatient Prescriptions  Medication Sig Dispense Refill  . carvedilol (COREG) 3.125 MG tablet Take 1 tablet (3.125 mg total) by mouth 2 (two) times daily. 60 tablet 3  . finasteride (PROSCAR) 5 MG tablet Take 5 mg by mouth daily. Tke one tablet once a day to help with prostate    . glucose blood (FREESTYLE LITE) test strip 1 each by Other route as needed for other. Use as instructed, test blood sugar twice daily.    Marland Kitchen ibuprofen (ADVIL,MOTRIN) 200 MG tablet Take 200 mg by mouth at bedtime.    . Lancets (FREESTYLE) lancets 1 each by Other route as needed for other. Use as instructed    . metformin (FORTAMET)  500 MG (OSM) 24 hr tablet Take 500 mg by mouth daily with breakfast. To help control diabetes    . nitroGLYCERIN (NITROSTAT) 0.4 MG SL tablet Place 1 tablet (0.4 mg total) under the tongue every 5 (five) minutes as needed for chest pain. 25 tablet 3  . omeprazole (PRILOSEC) 40 MG capsule Take 40 mg by mouth daily.    . polyethylene glycol (MIRALAX / GLYCOLAX) packet Take 17 grams in 8 oz of water daily as needed for constipation.    . simvastatin (ZOCOR) 40 MG tablet TAKE 1 TABLET BY MOUTH ONCE DAILY TO LOWER CHOLESTEROL 90 tablet 1  . solifenacin (VESICARE) 10 MG tablet Take 10 mg by mouth daily.    . tamsulosin (FLOMAX) 0.4 MG CAPS capsule TAKE 1 CAPSULE BY MOUTH ONCE DAILY TO HELP PROSTATE 90 capsule 0  . zolpidem (AMBIEN) 10 MG tablet TAKE 1 TABLET BY MOUTH AT BEDTIME FOR SLEEP 30 tablet 0   No current facility-administered medications for this visit.    Allergies:   Altace; Aricept; Codeine; Cymbalta; Feldene; Namenda; and Ultram   Social History: Social History   Social History  . Marital Status: Single    Spouse Name: N/A  . Number of Children: N/A  . Years of Education: N/A   Occupational History  . Not on file.   Social History Main Topics  . Smoking status: Former Smoker    Types: Cigarettes    Quit date: 03/28/1938  . Smokeless tobacco: Never Used  . Alcohol Use: No  . Drug Use: No  . Sexual Activity: No   Other Topics Concern  . Not on file   Social History Narrative   Wife died of Alzheimer's on February 20, 2014.    Family History: Family History  Problem Relation Age of Onset  . Cancer Mother   . Heart disease Father   . Cancer Sister     breast  . Arthritis Brother   . Kidney disease Brother     Ureterolithiasis  . Heart disease Sister     CAD and stent     Review of Systems: All other systems reviewed and are otherwise negative except as noted above.   Physical Exam: VS:  BP 150/70 mmHg  Pulse 61  Ht 6' (1.829 m)  Wt 181 lb 9.6 oz (82.373 kg)   BMI 24.62 kg/m2  SpO2 97% , BMI Body mass index is 24.62 kg/(m^2).  GEN- The patient is elderly appearing, alert and oriented x 3 today, HOH HEENT: normocephalic, atraumatic; sclera clear, conjunctiva pink; hearing intact; oropharynx clear; neck supple  Lungs- Clear to ausculation bilaterally, normal work of breathing.  No wheezes, rales, rhonchi Heart- Regular rate and  rhythm  GI- soft, non-tender, non-distended, bowel sounds present  Extremities- no clubbing, cyanosis, or edema; DP/PT/radial pulses 2+ bilaterally MS- no significant deformity or atrophy Skin- warm and dry, no rash or lesion; PPM pocket well healed Psych- euthymic mood, full affect Neuro- strength and sensation are intact  PPM Interrogation- reviewed in detail today,  See PACEART report  EKG:  EKG is ordered today. The ekg ordered today shows sinus rhythm, rate 61, LBBB  Recent Labs: No results found for requested labs within last 365 days.   Wt Readings from Last 3 Encounters:  02/23/15 181 lb 9.6 oz (82.373 kg)  11/06/14 184 lb 3.2 oz (83.553 kg)  05/20/14 182 lb (82.555 kg)     Other studies Reviewed: Additional studies/ records that were reviewed today include: Dr Olin Pia office notes  Assessment and Plan:  1.  Symptomatic bradycardia Pacemaker at [facility]  He has developed increasing dizziness and falls since device reaching ERI. Device diagnostics not available at this time. Pacemaker generator change recommended. Risks, benefits discussed with patient who wishes to proceed. Will schedule with Dr Caryl Comes at the next available time.     2.  CAD s/p CABG No recent ischemic symptoms  Continue BB  3.  HTN Stable No change required today  4.  Peripheral neuropathy The patient reports persistent peripheral neuropathy. He is unaware of results of last A1C. Advised to follow up with PCP for treatment options.    Current medicines are reviewed at length with the patient today.   The patient does not have  concerns regarding his medicines.  The following changes were made today:  none  Labs/ tests ordered today include: BMET, CBC   Disposition:   Follow up with Dr Caryl Comes after generator change   Signed, Chanetta Marshall, NP 02/23/2015 9:40 AM  [place] [address] [phone] (office) [phone] (fax)

## 2015-03-09 NOTE — Interval H&P Note (Signed)
History and Physical Interval Note:  03/09/2015 9:49 AM  Dustin Davila  has presented today for surgery, with the diagnosis of hb  The various methods of treatment have been discussed with the patient and family. After consideration of risks, benefits and other options for treatment, the patient has consented to  Procedure(s):  PPM Generator Changeout / Battery Only (N/A) as a surgical intervention .  The patient's history has been reviewed, patient examined, no change in status, stable for surgery.  I have reviewed the patient's chart and labs.  Questions were answered to the patient's satisfaction.     Virl Axe

## 2015-03-09 NOTE — Discharge Instructions (Signed)

## 2015-03-18 ENCOUNTER — Ambulatory Visit (INDEPENDENT_AMBULATORY_CARE_PROVIDER_SITE_OTHER): Payer: Medicare Other | Admitting: *Deleted

## 2015-03-18 ENCOUNTER — Encounter: Payer: Self-pay | Admitting: Internal Medicine

## 2015-03-18 DIAGNOSIS — Z950 Presence of cardiac pacemaker: Secondary | ICD-10-CM

## 2015-03-18 DIAGNOSIS — I495 Sick sinus syndrome: Secondary | ICD-10-CM

## 2015-03-18 LAB — CUP PACEART INCLINIC DEVICE CHECK
Brady Statistic AP VS Percent: 82 %
Brady Statistic AS VS Percent: 18 %
Date Time Interrogation Session: 20161221102650
Implantable Lead Implant Date: 20060803
Implantable Lead Location: 753859
Implantable Lead Location: 753860
Lead Channel Impedance Value: 659 Ohm
Lead Channel Pacing Threshold Pulse Width: 0.4 ms
Lead Channel Sensing Intrinsic Amplitude: 22.4 mV
Lead Channel Setting Pacing Amplitude: 2 V
Lead Channel Setting Pacing Pulse Width: 0.4 ms
MDC IDC LEAD IMPLANT DT: 20060803
MDC IDC MSMT BATTERY IMPEDANCE: 100 Ohm
MDC IDC MSMT BATTERY REMAINING LONGEVITY: 155 mo
MDC IDC MSMT BATTERY VOLTAGE: 2.8 V
MDC IDC MSMT LEADCHNL RA IMPEDANCE VALUE: 608 Ohm
MDC IDC MSMT LEADCHNL RA PACING THRESHOLD AMPLITUDE: 0.5 V
MDC IDC MSMT LEADCHNL RA PACING THRESHOLD PULSEWIDTH: 0.4 ms
MDC IDC MSMT LEADCHNL RA SENSING INTR AMPL: 1.4 mV
MDC IDC MSMT LEADCHNL RV PACING THRESHOLD AMPLITUDE: 0.75 V
MDC IDC SET LEADCHNL RV PACING AMPLITUDE: 2.5 V
MDC IDC SET LEADCHNL RV SENSING SENSITIVITY: 5.6 mV
MDC IDC STAT BRADY AP VP PERCENT: 0 %
MDC IDC STAT BRADY AS VP PERCENT: 0 %

## 2015-03-18 NOTE — Progress Notes (Signed)
Wound check appointment after generator change. Dermabond removed. Wound without redness or edema. Incision edges approximated, wound well healed. Normal device function. Thresholds, sensing, and impedances consistent with implant measurements. Histogram distribution appropriate for patient and level of activity. 1 mode switch noted (<0.1%), no EGM as <30sec duration. No high ventricular rates noted. Patient educated about wound care, arm mobility, lifting restrictions. ROV with SK on 06/12/15 at 9:15am.

## 2015-04-13 ENCOUNTER — Encounter: Payer: Self-pay | Admitting: Internal Medicine

## 2015-04-16 ENCOUNTER — Other Ambulatory Visit: Payer: Self-pay | Admitting: Internal Medicine

## 2015-04-20 ENCOUNTER — Other Ambulatory Visit: Payer: Medicare Other

## 2015-04-20 DIAGNOSIS — I10 Essential (primary) hypertension: Secondary | ICD-10-CM

## 2015-04-20 DIAGNOSIS — E785 Hyperlipidemia, unspecified: Secondary | ICD-10-CM

## 2015-04-20 DIAGNOSIS — E119 Type 2 diabetes mellitus without complications: Secondary | ICD-10-CM

## 2015-04-21 LAB — BASIC METABOLIC PANEL
BUN / CREAT RATIO: 22 (ref 10–22)
BUN: 17 mg/dL (ref 8–27)
CHLORIDE: 103 mmol/L (ref 96–106)
CO2: 27 mmol/L (ref 18–29)
Calcium: 9 mg/dL (ref 8.6–10.2)
Creatinine, Ser: 0.78 mg/dL (ref 0.76–1.27)
GFR calc Af Amer: 94 mL/min/{1.73_m2} (ref >59)
GFR calc non Af Amer: 82 mL/min/{1.73_m2} (ref >59)
GLUCOSE: 123 mg/dL — AB (ref 65–99)
Potassium: 3.6 mmol/L (ref 3.5–5.2)
SODIUM: 148 mmol/L — AB (ref 134–144)

## 2015-04-21 LAB — LIPID PANEL
CHOLESTEROL TOTAL: 124 mg/dL (ref 100–199)
Chol/HDL Ratio: 2.6 ratio units (ref 0.0–5.0)
HDL: 48 mg/dL (ref >39)
LDL Calculated: 56 mg/dL (ref 0–99)
Triglycerides: 102 mg/dL (ref 0–149)
VLDL CHOLESTEROL CAL: 20 mg/dL (ref 5–40)

## 2015-04-21 LAB — MICROALBUMIN, URINE: Microalbumin, Urine: 52.3 ug/mL

## 2015-04-21 LAB — HEMOGLOBIN A1C
ESTIMATED AVERAGE GLUCOSE: 126 mg/dL
Hgb A1c MFr Bld: 6 % — ABNORMAL HIGH (ref 4.8–5.6)

## 2015-04-22 ENCOUNTER — Encounter: Payer: Self-pay | Admitting: Internal Medicine

## 2015-04-22 ENCOUNTER — Ambulatory Visit (INDEPENDENT_AMBULATORY_CARE_PROVIDER_SITE_OTHER): Payer: Medicare Other | Admitting: Internal Medicine

## 2015-04-22 VITALS — BP 138/66 | HR 65 | Temp 97.4°F | Ht 72.0 in | Wt 175.6 lb

## 2015-04-22 DIAGNOSIS — E785 Hyperlipidemia, unspecified: Secondary | ICD-10-CM | POA: Diagnosis not present

## 2015-04-22 DIAGNOSIS — I10 Essential (primary) hypertension: Secondary | ICD-10-CM | POA: Diagnosis not present

## 2015-04-22 DIAGNOSIS — Z950 Presence of cardiac pacemaker: Secondary | ICD-10-CM | POA: Diagnosis not present

## 2015-04-22 DIAGNOSIS — N3941 Urge incontinence: Secondary | ICD-10-CM | POA: Diagnosis not present

## 2015-04-22 DIAGNOSIS — G301 Alzheimer's disease with late onset: Secondary | ICD-10-CM | POA: Diagnosis not present

## 2015-04-22 DIAGNOSIS — F0280 Dementia in other diseases classified elsewhere without behavioral disturbance: Secondary | ICD-10-CM | POA: Diagnosis not present

## 2015-04-22 DIAGNOSIS — N401 Enlarged prostate with lower urinary tract symptoms: Secondary | ICD-10-CM | POA: Diagnosis not present

## 2015-04-22 DIAGNOSIS — M25571 Pain in right ankle and joints of right foot: Secondary | ICD-10-CM | POA: Diagnosis not present

## 2015-04-22 DIAGNOSIS — R32 Unspecified urinary incontinence: Secondary | ICD-10-CM | POA: Insufficient documentation

## 2015-04-22 DIAGNOSIS — N138 Other obstructive and reflux uropathy: Secondary | ICD-10-CM

## 2015-04-22 DIAGNOSIS — G4700 Insomnia, unspecified: Secondary | ICD-10-CM | POA: Diagnosis not present

## 2015-04-22 DIAGNOSIS — E119 Type 2 diabetes mellitus without complications: Secondary | ICD-10-CM | POA: Diagnosis not present

## 2015-04-22 NOTE — Patient Instructions (Signed)
Call son Dustin Davila [phone] or [phone] about Urology Appointment.

## 2015-04-22 NOTE — Progress Notes (Signed)
Patient ID: Dustin Davila, male   DOB: 14-Jul-1926, 82 y.o.   MRN: [id number]    Facility  [facility]    Place of Service:   OFFICE    Allergies  Allergen Reactions  . Altace [Ramipril]     unknown  . Aricept [Donepezil Hcl]     unknown   . Codeine     unknown   . Cymbalta [Duloxetine Hcl]     unknown   . Feldene [Piroxicam]     unknown   . Namenda [Memantine Hcl] Other (See Comments)    nightmares  . Ultram [Tramadol]     unknown     Chief Complaint  Patient presents with  . Follow-up    Follow-up per Dr.Melissaann Dizdarevic  . Medication Management    Discuss newly advertised memory medication (Prevagen)    HPI:  Patient has not been seen 11 months. He returns for a routine checkup for his multiple medical problems.  Urge incontinence of urine - patient has had almost 2 years of this complaint. He denies dysuria. Incontinence seems to follow a sudden urge to void and before he can get to the bathroom he has wet himself. Previously this was attributed to BPH. Patient has been put on Proscar and tamsulosin, but it does not seem like he is taking them regularly. He says he could not see that they helped.  Late onset Alzheimer's disease without behavioral disturbance - patient is concerned about his memory. He has previously been tried on both donepezil and memantine, but they were listed in his drug intolerances primarily for GI side effects. He wonders if he should start taking an over-the-counter product called Prevagen, which is a nutritional supplement.  Cardiac pacemaker in situ - generator placement by Dr. Caryl Comes 03/09/2015. Wound has healed and pacemaker is functioning well.  BPH (benign prostatic hypertrophy) with urinary obstruction - incontinence issues previously attributed to BPH  Diabetes mellitus without complication ([place]) - recent labs suggest good control with hemoglobin A1c 6.0  Essential hypertension - controlled  Hyperlipidemia - controlled  Pain in joint, ankle  and foot, right - poor historian. Is difficult to tell whether this may be a neuropathy related to his diabetes or is more a structural problem related to tendinitis in the foot.  Insomnia, unspecified - chronic issue. Patient forgot to pick up his prescription for Ambien. It is waiting for him at the drugstore.    Medications: Patient's Medications  New Prescriptions   No medications on file  Previous Medications   CARVEDILOL (COREG) 3.125 MG TABLET    Take 1 tablet (3.125 mg total) by mouth 2 (two) times daily.   FINASTERIDE (PROSCAR) 5 MG TABLET    Take 5 mg by mouth daily. Tke one tablet once a day to help with prostate   GLUCOSE BLOOD (FREESTYLE LITE) TEST STRIP    1 each by Other route as needed for other. Use as instructed, test blood sugar twice daily.   IBUPROFEN (ADVIL,MOTRIN) 200 MG TABLET    Take 200 mg by mouth at bedtime.   LANCETS (FREESTYLE) LANCETS    1 each by Other route as needed for other. Use as instructed   METFORMIN (FORTAMET) 500 MG (OSM) 24 HR TABLET    Take 500 mg by mouth daily with breakfast. To help control diabetes   OMEPRAZOLE (PRILOSEC) 40 MG CAPSULE    Take 40 mg by mouth daily.   POLYETHYLENE GLYCOL (MIRALAX / GLYCOLAX) PACKET    Take 17 grams in 8  oz of water daily as needed for constipation.   SIMVASTATIN (ZOCOR) 40 MG TABLET    TAKE 1 TABLET BY MOUTH ONCE DAILY TO LOWER CHOLESTEROL   TAMSULOSIN (FLOMAX) 0.4 MG CAPS CAPSULE    TAKE 1 CAPSULE BY MOUTH ONCE DAILY TO HELP PROSTATE   ZOLPIDEM (AMBIEN) 10 MG TABLET    TAKE 1 TABLET BY MOUTH NIGHTLY AT BEDTIME AS NEEDED FOR SLEEP.  Modified Medications   No medications on file  Discontinued Medications   NITROGLYCERIN (NITROSTAT) 0.4 MG SL TABLET    Place 1 tablet (0.4 mg total) under the tongue every 5 (five) minutes as needed for chest pain.   SOLIFENACIN (VESICARE) 10 MG TABLET    Take 10 mg by mouth daily.    Review of Systems  Constitutional: Positive for activity change, appetite change and fatigue.    HENT: Positive for hearing loss.   Eyes: Negative.   Respiratory: Positive for chest tightness and shortness of breath.   Cardiovascular: Positive for chest pain and leg swelling. Negative for palpitations.       Pacemaker  Gastrointestinal:       Prior history includes intermittent diarrhea, intermittent constipation, heartburn and reflux, and occasional nausea. He has had some difficulty with swallowing in the past. Appetite is poor. He frequently feels full befor finishing a meal.  Genitourinary:       Delays in starting urination. Hesitancy. Nocturia x2. Urge incontinence. Claims he is voiding every half hour during the day.  Musculoskeletal:       Arthritis. Shoulder pain and neck pains.  Skin: Negative.   Neurological:       Numb, especially in the right hand. Loss of sensation in the feet. Unsteady. Memory loss. History of confusion. Memory getting worse.  Hematological: Negative.   Psychiatric/Behavioral:       Insomnia. fatigued. Feeling sad most of the time.   All other systems reviewed and are negative.   Filed Vitals:   04/22/15 1619  BP: 138/66  Pulse: 65  Temp: 97.4 F (36.3 C)  TempSrc: Oral  Height: 6' (1.829 m)  Weight: 175 lb 9.6 oz (79.652 kg)  SpO2: 95%   Body mass index is 23.81 kg/(m^2). Filed Weights   04/22/15 1619  Weight: 175 lb 9.6 oz (79.652 kg)     Physical Exam  Constitutional: He is oriented to person, place, and time. He appears well-nourished. He appears distressed.  HENT:  Bilateral partial hearing deficits.  Eyes:  Corrective lens.  Neck: Normal range of motion. Neck supple. No JVD present. No tracheal deviation present. No thyromegaly present.  Cardiovascular: Normal rate, regular rhythm and normal heart sounds.  Exam reveals no gallop and no friction rub.   No murmur heard. Pacemaker left upper chest wall.  Pulmonary/Chest: Effort normal and breath sounds normal. No respiratory distress. He has no wheezes. He has no rales.   Abdominal: Soft. Bowel sounds are normal. He exhibits no distension and no mass. There is no tenderness.  Musculoskeletal: Normal range of motion. He exhibits edema. He exhibits no tenderness.  Lymphadenopathy:    He has no cervical adenopathy.  Neurological: He is alert and oriented to person, place, and time. No cranial nerve deficit. Coordination normal.  Absent vibratory and monofilament sensations 10/09/13 MMSE 27/30. Failed clock drawing.  Skin: Skin is warm and dry. No rash noted. No erythema. No pallor.  Psychiatric:  Sad. Speaks slowly at times.    Labs reviewed: Lab Summary Latest Ref Rng 04/20/2015 02/23/2015 01/07/2014  Hemoglobin 13.0 - 17.0 g/dL (None) 14.6 (None)  Hematocrit 39.0 - 52.0 % (None) 41.6 (None)  White count 4.0 - 10.5 K/uL (None) 6.0 (None)  Platelet count 150 - 400 K/uL (None) 146(L) (None)  Sodium 134 - 144 mmol/L 148(H) 142 145(H)  Potassium 3.5 - 5.2 mmol/L 3.6 4.0 4.3  Calcium 8.6 - 10.2 mg/dL 9.0 8.7 8.7  Phosphorus - (None) (None) (None)  Creatinine 0.76 - 1.27 mg/dL 0.78 0.82 0.94  AST - (None) (None) (None)  Alk Phos - (None) (None) (None)  Bilirubin - (None) (None) (None)  Glucose 65 - 99 mg/dL 123(H) 108(H) 131(H)  Cholesterol - (None) (None) (None)  HDL cholesterol >39 mg/dL 48 (None) (None)  Triglycerides 0 - 149 mg/dL 102 (None) (None)  LDL Direct - (None) (None) (None)  LDL Calc 0 - 99 mg/dL 56 (None) (None)  Total protein - (None) (None) (None)  Albumin - (None) (None) (None)   No results found for: TSH, T3TOTAL, T4TOTAL, THYROIDAB Lab Results  Component Value Date   BUN 17 04/20/2015   BUN 21 02/23/2015   BUN 17 01/07/2014   Lab Results  Component Value Date   HGBA1C 6.0* 04/20/2015   HGBA1C 6.3* 01/07/2014   HGBA1C 6.0* 10/07/2013    Assessment/Plan 1. Urge incontinence of urine - Ambulatory referral to Urology  2. Late onset Alzheimer's disease without behavioral disturbance Advised patient that I was not sure the  Prevagen would be of any help to him, but there is no contraindication to using it.  3. Cardiac pacemaker in situ Functioning well  4. BPH (benign prostatic hypertrophy) with urinary obstruction Urology referral  5. Diabetes mellitus without complication (HCC) Controlled  6. Essential hypertension Controlled  7. Hyperlipidemia Controlled  8. Pain in joint, ankle and foot, right Use gel pads and shoes and try Aspercreme or Myoflex for comfort  9. Insomnia, unspecified Prescription filled for zolpidem

## 2015-05-06 ENCOUNTER — Other Ambulatory Visit: Payer: Self-pay | Admitting: Internal Medicine

## 2015-05-25 ENCOUNTER — Other Ambulatory Visit: Payer: Self-pay | Admitting: *Deleted

## 2015-05-25 MED ORDER — ZOLPIDEM TARTRATE 10 MG PO TABS
ORAL_TABLET | ORAL | Status: DC
Start: 2015-05-25 — End: 2015-09-30

## 2015-05-25 NOTE — Telephone Encounter (Signed)
[facility] 

## 2015-06-12 ENCOUNTER — Encounter: Payer: Self-pay | Admitting: Internal Medicine

## 2015-06-12 ENCOUNTER — Ambulatory Visit (INDEPENDENT_AMBULATORY_CARE_PROVIDER_SITE_OTHER): Payer: Medicare Other | Admitting: Internal Medicine

## 2015-06-12 VITALS — BP 142/86 | HR 68 | Ht 72.0 in | Wt 176.8 lb

## 2015-06-12 DIAGNOSIS — I495 Sick sinus syndrome: Secondary | ICD-10-CM

## 2015-06-12 DIAGNOSIS — Z45018 Encounter for adjustment and management of other part of cardiac pacemaker: Secondary | ICD-10-CM

## 2015-06-12 NOTE — Progress Notes (Signed)
Dustin Davila      Patient Care Team: Dustin Dooms, MD as PCP - General (Geriatric Medicine)   HPI  Dustin Davila is a 82 y.o. male Seen in followup for pacemaker implanted for symptomatic sinus bradycardia. He also has coronary artery disease with prior LAD stenting and bypass surgery. Previously seen by Dr.RA W. And Linus Salmons He underwent generator replacement 12/16   He has some exercise intolerance primarily related to his legs giving out. He denies chest discomfort or shortness of breath.  [facility] [place]  Echocardiogram 8/11 demonstrated ejection fraction of about 50% with some motion abnormalities Myoview 8/15 illustrated ejection fraction of 48% without active ischemia     Past Medical History  Diagnosis Date  . Allergy   . Ischemic cardiomyopathy s/p CABG 2004   . Anxiety   . Diabetes mellitus without complication (Dustin Davila)   . Cataract   . Sinus node dysfunction (HCC)   . GERD (gastroesophageal reflux disease)   . Hypertension   . Metatarsalgia of both feet   . Debility, unspecified   . Gout, unspecified   . Urinary frequency   . Sebaceous cyst   . Other seborrheic keratosis   . Unspecified nonsenile cataract   . Pain in joint, shoulder region   . Cervicalgia   . Memory loss 07/20/2005  . Irritable bowel syndrome   . Neoplasm of uncertain behavior of lip, oral cavity, and pharynx   . Macular degeneration (senile) of retina, unspecified   . Syncope and collapse   . Blastomycosis   . Peptic ulcer, unspecified site, unspecified as acute or chronic, without mention of hemorrhage, perforation, or obstruction   . Blood in stool   . Insomnia, unspecified   . Left bundle branch block   . Elevated prostate specific antigen (PSA)   . Lipoma of other skin and subcutaneous tissue   . Gastroparesis   . peripheral neuropathy   . Lumbago   . Carpal tunnel syndrome   . Osteoarthrosis, unspecified whether generalized or  localized, unspecified site   . Achalasia and cardiospasm   . Pacemaker-Medtronic   . Diverticulitis of colon (without mention of hemorrhage)   . CORONARY ARTERY DISEASE 05/27/2008    Qualifier: Diagnosis of  By: Edison Pace RN, Dustin      Past Surgical History  Procedure Laterality Date  . Cataract extraction    . Open heart surgery  12/1997    DR GAMBLE  . Eye surgery    . Vasectomy  1976  . Appendectomy  1995  . (r) ear implant      DR WELLS  . Nasal sinus surgery  1978    DR WELLS  . Shoulder arthroscopy w/ rotator cuff repair  1995    DR HARKINS  . Excise thrombosed hemorrhoid  07/1996    DR LEONE   . Minor hemorrhoidectomy  04/1998    DR LEONE   . Coronary artery bypass graft  01/2003    DR HENDRICKSON  . Pacemaker insertion  10/28/2004    DR MCQUEEN  . Nodules removed left arm  05/02/2006    DR GROSS  . Ep implantable device N/A 03/09/2015    Procedure:  PPM Generator Changeout / Battery Only;  Surgeon: Deboraha Sprang, MD;  Location: [facility];  Service: Cardiovascular;  Laterality: N/A;    Current Outpatient Prescriptions  Medication Sig Dispense Refill  . carvedilol (COREG) 3.125 MG tablet Take 1 tablet (3.125  mg total) by mouth 2 (two) times daily. 60 tablet 3  . finasteride (PROSCAR) 5 MG tablet Take 5 mg by mouth daily. Tke one tablet once a day to help with prostate    . glucose blood (FREESTYLE LITE) test strip 1 each by Other route as needed for other. Use as instructed, test blood sugar twice daily.    Marland Kitchen ibuprofen (ADVIL,MOTRIN) 200 MG tablet Take 200 mg by mouth at bedtime.    . Lancets (FREESTYLE) lancets 1 each by Other route as needed for other. Use as instructed    . metformin (FORTAMET) 500 MG (OSM) 24 hr tablet Take 500 mg by mouth daily with breakfast. To help control diabetes    . omeprazole (PRILOSEC) 40 MG capsule Take 40 mg by mouth daily.    . polyethylene glycol (MIRALAX / GLYCOLAX) packet Take 17 grams in 8 oz of water daily as needed for  constipation.    . simvastatin (ZOCOR) 40 MG tablet TAKE 1 TABLET BY MOUTH ONCE DAILY TO LOWER CHOLESTEROL 90 tablet 1  . zolpidem (AMBIEN) 10 MG tablet Take one tablet by mouth once nightly at bedtime as needed for sleep 30 tablet 0   No current facility-administered medications for this visit.    Allergies  Allergen Reactions  . Altace [Ramipril]     unknown  . Aricept [Donepezil Hcl]     unknown   . Codeine     unknown   . Cymbalta [Duloxetine Hcl]     unknown   . Feldene [Piroxicam]     unknown   . Namenda [Memantine Hcl] Other (See Comments)    nightmares  . Ultram [Tramadol]     unknown     Review of Systems negative except from HPI and PMH  Physical Exam BP 142/86 mmHg  Pulse 68  Ht 6' (1.829 m)  Wt 176 lb 12.8 oz (80.196 kg)  BMI 23.97 kg/m2 Well developed and well nourished in no acute distress HENT normal E scleral and icterus clear Neck Supple JVP flat; carotids brisk and full Clear to ausculation  Regular rate and rhythm, no murmurs gallops or rub Soft with active bowel sounds No tenderness No clubbing cyanosis  Edema Good peripheral pulses Alert and oriented, grossly normal motor and sensory function Skin Warm and Dry  ECG demonstrated sinus rhythm at 8 Intervals 19/15/47  Left axis deviation Left bundle branch block   Assessment and  Plan  Ischemic heart disease s/p CABG    Left bundle branch block  Sinus node dysfunction  Hypertension  Pacemaker-Medtronic The patient's device was interrogated and the information was fully reviewed.  Reprogrammed as below.     Not quite sure the cause of his exercise intolerance in his legs. Perfusion feels good. I suspect some of this is diabetic neuropathy. Some of it may be poor conditioning. We have discussed the strategy of the progressive exercise.  We have reprogrammed his device to decrease his ADL rate.

## 2015-06-12 NOTE — Patient Instructions (Signed)
Medication Instructions:  Your physician recommends that you continue on your current medications as directed. Please refer to the Current Medication list given to you today.  Labwork: None ordered  Testing/Procedures: None ordered  Follow-Up: Remote monitoring is used to monitor your Pacemaker of ICD from home. This monitoring reduces the number of office visits required to check your device to one time per year. It allows [place] to keep an eye on the functioning of your device to ensure it is working properly. You are scheduled for a device check from home on 09/14/2015. You may send your transmission at any time that day. If you have a wireless device, the transmission will be sent automatically. After your physician reviews your transmission, you will receive a postcard with your next transmission date.  Your physician wants you to follow-up in: 9 months with Dr. Caryl Comes.. You will receive a reminder letter in the mail two months in advance. If you don't receive a letter, please call our office to schedule the follow-up appointment.  If you need a refill on your cardiac medications before your next appointment, please call your pharmacy.  Thank you for choosing [facility]!!

## 2015-06-17 LAB — CUP PACEART INCLINIC DEVICE CHECK
Battery Impedance: 100 Ohm
Battery Remaining Longevity: 150 mo
Battery Voltage: 2.79 V
Brady Statistic AP VP Percent: 0 %
Brady Statistic AP VS Percent: 80 %
Brady Statistic AS VP Percent: 0 %
Date Time Interrogation Session: 20170317135013
Implantable Lead Location: 753860
Implantable Lead Model: 4092
Implantable Lead Model: 5076
Lead Channel Impedance Value: 619 Ohm
Lead Channel Impedance Value: 651 Ohm
Lead Channel Pacing Threshold Amplitude: 0.625 V
Lead Channel Pacing Threshold Pulse Width: 0.4 ms
Lead Channel Pacing Threshold Pulse Width: 0.4 ms
Lead Channel Setting Sensing Sensitivity: 5.6 mV
MDC IDC LEAD IMPLANT DT: 20060803
MDC IDC LEAD IMPLANT DT: 20060803
MDC IDC LEAD LOCATION: 753859
MDC IDC MSMT LEADCHNL RA SENSING INTR AMPL: 1.4 mV
MDC IDC MSMT LEADCHNL RV PACING THRESHOLD AMPLITUDE: 0.75 V
MDC IDC MSMT LEADCHNL RV SENSING INTR AMPL: 31.36 mV
MDC IDC SET LEADCHNL RA PACING AMPLITUDE: 2 V
MDC IDC SET LEADCHNL RV PACING AMPLITUDE: 2.5 V
MDC IDC SET LEADCHNL RV PACING PULSEWIDTH: 0.4 ms
MDC IDC STAT BRADY AS VS PERCENT: 19 %

## 2015-07-21 ENCOUNTER — Encounter: Payer: Self-pay | Admitting: Internal Medicine

## 2015-07-21 ENCOUNTER — Ambulatory Visit (INDEPENDENT_AMBULATORY_CARE_PROVIDER_SITE_OTHER): Payer: Medicare Other | Admitting: Internal Medicine

## 2015-07-21 VITALS — BP 140/76 | HR 73 | Temp 97.4°F | Ht 72.0 in | Wt 171.0 lb

## 2015-07-21 DIAGNOSIS — F339 Major depressive disorder, recurrent, unspecified: Secondary | ICD-10-CM

## 2015-07-21 DIAGNOSIS — F0280 Dementia in other diseases classified elsewhere without behavioral disturbance: Secondary | ICD-10-CM | POA: Diagnosis not present

## 2015-07-21 DIAGNOSIS — G4700 Insomnia, unspecified: Secondary | ICD-10-CM

## 2015-07-21 DIAGNOSIS — E119 Type 2 diabetes mellitus without complications: Secondary | ICD-10-CM | POA: Diagnosis not present

## 2015-07-21 DIAGNOSIS — R972 Elevated prostate specific antigen [PSA]: Secondary | ICD-10-CM

## 2015-07-21 DIAGNOSIS — N3941 Urge incontinence: Secondary | ICD-10-CM | POA: Diagnosis not present

## 2015-07-21 DIAGNOSIS — R5381 Other malaise: Secondary | ICD-10-CM | POA: Diagnosis not present

## 2015-07-21 DIAGNOSIS — G301 Alzheimer's disease with late onset: Secondary | ICD-10-CM | POA: Diagnosis not present

## 2015-07-21 DIAGNOSIS — I10 Essential (primary) hypertension: Secondary | ICD-10-CM

## 2015-07-21 DIAGNOSIS — E785 Hyperlipidemia, unspecified: Secondary | ICD-10-CM

## 2015-07-21 NOTE — Progress Notes (Signed)
Patient ID: Dustin Davila, male   DOB: 06-07-1926, 82 y.o.   MRN: [id number]    Facility  [facility]    Place of Service:   OFFICE    Allergies  Allergen Reactions  . Altace [Ramipril]     unknown  . Aricept [Donepezil Hcl]     unknown   . Codeine     unknown   . Cymbalta [Duloxetine Hcl]     unknown   . Feldene [Piroxicam]     unknown   . Namenda [Memantine Hcl] Other (See Comments)    nightmares  . Ultram [Tramadol]     unknown     Chief Complaint  Patient presents with  . Medical Management of Chronic Issues    3 month ov blood sugar, blood pressure, Alzheimer's. Here with son Dustin Davila  . confusion    drove wrong way on ramp  . Cellulitis    06/21/15 on right leg, swollen, red, went to ER    HPI:  Feeling poorly.  Episode of disorientation and driving on the wrong side of the road one night. Patient and son are aware that his memory is getting worse. Patient was intolerant to trials of both Aricept and memantine in the past.  Shooting pains in the feet and right leg.  Constipated. Using Dulcolax suppositories.   Arthritis getting worse. Sore in the hands anf the right arm. Has been using 3-4 Tylenol 3-4 times daily.   Cellulitis in the leg. [place] at [facility].   Had a couple of falls since last seen.  Has been more inactive. Muscles getting weaker.   Cleaned out his old home. Moved to [facility] in [place]. He currently has a new apartment, but this is an area that is not as close to dining hall and fitness center and some of the older apartments. His son is thinking about moving him to an older apartment so that he can walk to the fitness center and to dining areas. In the meantime, he is getting meals delivered to him. His son is thinking about hiring a person to come 3 or 4 hours daily to assist with" taking care of things" in the apartment.  Insomnia with little rest at night.  Used zolpidem. Has quit it. Trouble with sleepng increased after moving to [place]. Now using melatonin.   Medications: Patient's Medications  New Prescriptions   No medications on file  Previous Medications   CARVEDILOL (COREG) 3.125 MG TABLET    Take 1 tablet (3.125 mg total) by mouth 2 (two) times daily.   FINASTERIDE (PROSCAR) 5 MG TABLET    Take 5 mg by mouth daily. Tke one tablet once a day to help with prostate   GLUCOSE BLOOD (FREESTYLE LITE) TEST STRIP    1 each by Other route as needed for other. Use as instructed, test blood sugar twice daily.   IBUPROFEN (ADVIL,MOTRIN) 200 MG TABLET    Take 200 mg by mouth at bedtime.   LANCETS (FREESTYLE) LANCETS    1 each by Other route as needed for other. Use as instructed   METFORMIN (FORTAMET) 500 MG (OSM) 24 HR TABLET    Take 500 mg by mouth daily with breakfast. To help control diabetes   OMEPRAZOLE (PRILOSEC) 40 MG CAPSULE    Take 40 mg by mouth daily.   POLYETHYLENE GLYCOL (MIRALAX / GLYCOLAX) PACKET    Take 17 grams in 8 oz of  water daily as needed for constipation.   SIMVASTATIN (ZOCOR) 40 MG TABLET    TAKE 1 TABLET BY MOUTH ONCE DAILY TO LOWER CHOLESTEROL   ZOLPIDEM (AMBIEN) 10 MG TABLET    Take one tablet by mouth once nightly at bedtime as needed for sleep  Modified Medications   No medications on file  Discontinued Medications   No medications on file    Review of Systems  Constitutional: Positive for activity change, appetite change and fatigue.  HENT: Positive for hearing loss.   Eyes: Negative.   Respiratory: Positive for chest tightness and shortness of breath.   Cardiovascular: Positive for chest pain and leg swelling. Negative for palpitations.       Pacemaker  Gastrointestinal:       Prior history includes intermittent diarrhea, intermittent constipation, heartburn and reflux, and occasional nausea. He has had some difficulty with swallowing in the past. Appetite is poor. He frequently feels full before finishing a  meal.  Endocrine:       Diabetes mellitus  Genitourinary:       Delays in starting urination. Hesitancy. Nocturia x2. Urge incontinence. Claims he is voiding every half hour during the day.  Musculoskeletal:       Arthritis. Shoulder pain and neck pains.  Skin: Negative.   Neurological:       Numb, especially in the right hand. Loss of sensation in the feet. Unsteady. Memory loss. History of confusion. Memory getting worse.  Hematological: Negative.   Psychiatric/Behavioral:       Insomnia. fatigued. Feeling sad most of the time.   All other systems reviewed and are negative.   Filed Vitals:   07/21/15 1546  BP: 140/76  Pulse: 73  Temp: 97.4 F (36.3 C)  TempSrc: Oral  Height: 6' (1.829 m)  Weight: 171 lb (77.565 kg)  SpO2: 97%   Body mass index is 23.19 kg/(m^2). Filed Weights   07/21/15 1546  Weight: 171 lb (77.565 kg)     Physical Exam  Constitutional: He is oriented to person, place, and time. He appears well-nourished. He appears distressed.  HENT:  Bilateral partial hearing deficits.  Eyes:  Corrective lens.  Neck: Normal range of motion. Neck supple. No JVD present. No tracheal deviation present. No thyromegaly present.  Cardiovascular: Normal rate, regular rhythm and normal heart sounds.  Exam reveals no gallop and no friction rub.   No murmur heard. Pacemaker left upper chest wall.  Pulmonary/Chest: Effort normal and breath sounds normal. No respiratory distress. He has no wheezes. He has no rales.  Abdominal: Soft. Bowel sounds are normal. He exhibits no distension and no mass. There is no tenderness.  Musculoskeletal: Normal range of motion. He exhibits edema. He exhibits no tenderness.  Lymphadenopathy:    He has no cervical adenopathy.  Neurological: He is alert and oriented to person, place, and time. No cranial nerve deficit. Coordination normal.  Absent vibratory and monofilament sensations 10/09/13 MMSE 27/30. Failed clock drawing.  Skin: Skin is  warm and dry. No rash noted. No erythema. No pallor.  Psychiatric:  Sad. Speaks slowly at times.    Labs reviewed: Lab Summary Latest Ref Rng 04/20/2015 02/23/2015 01/07/2014  Hemoglobin 13.0 - 17.0 g/dL (None) 14.6 (None)  Hematocrit 39.0 - 52.0 % (None) 41.6 (None)  White count 4.0 - 10.5 K/uL (None) 6.0 (None)  Platelet count 150 - 400 K/uL (None) 146(L) (None)  Sodium 134 - 144 mmol/L 148(H) 142 145(H)  Potassium 3.5 - 5.2 mmol/L 3.6 4.0 4.3  Calcium 8.6 - 10.2 mg/dL 9.0 8.7 8.7  Phosphorus - (None) (None) (None)  Creatinine 0.76 - 1.27 mg/dL 0.78 0.82 0.94  AST - (None) (None) (None)  Alk Phos - (None) (None) (None)  Bilirubin - (None) (None) (None)  Glucose 65 - 99 mg/dL 123(H) 108(H) 131(H)  Cholesterol - (None) (None) (None)  HDL cholesterol >39 mg/dL 48 (None) (None)  Triglycerides 0 - 149 mg/dL 102 (None) (None)  LDL Direct - (None) (None) (None)  LDL Calc 0 - 99 mg/dL 56 (None) (None)  Total protein - (None) (None) (None)  Albumin - (None) (None) (None)   No results found for: TSH, T3TOTAL, T4TOTAL, THYROIDAB Lab Results  Component Value Date   BUN 17 04/20/2015   BUN 21 02/23/2015   BUN 17 01/07/2014   Lab Results  Component Value Date   HGBA1C 6.0* 04/20/2015   HGBA1C 6.3* 01/07/2014   HGBA1C 6.0* 10/07/2013    Assessment/Plan  1. Late onset Alzheimer's disease without behavioral disturbance Although patient has known vascular problems, I think most likely etiology of his memory deficit is Alzheimer's. He previously was tried on both donepezil and memantine but could not tolerate either drug. It is possible that his current memory issues are related to depression. This possibility was discussed with both the patient had his son. -Discussed "The 36 hour Day" and Internet resources including Medline plus. - TSH; Future - CMP; Future - CT Head Wo Contrast; Future--- we will attempt to schedule this At [facility]  2. Diabetes mellitus  without complication ([place]) - CMP; Future - Hemoglobin A1c; Future  3. Essential hypertension - CMP; Future - Urinalysis; Future  4. Hyperlipidemia - Lipid panel; Future  5. Elevated prostate specific antigen (PSA) - PSA; Future  6. Insomnia, unspecified Possibly related to pain and depression -Start duloxetine 30 mg daily  7. Recurrent major depressive disorder, remission status unspecified (HCC) -Duloxetine 30 mg daily - TSH; Future - CBC With Differential; Future  8. Debility -Start exercising. Start walking. Use fitness center at [facility].  9. Urge incontinence of urine Son is looking for a urologist in the [place] area

## 2015-07-22 ENCOUNTER — Telehealth: Payer: Self-pay | Admitting: *Deleted

## 2015-07-22 DIAGNOSIS — R5381 Other malaise: Secondary | ICD-10-CM | POA: Insufficient documentation

## 2015-07-22 MED ORDER — DULOXETINE HCL 30 MG PO CPEP: ORAL_CAPSULE | ORAL | Status: AC

## 2015-07-22 MED ORDER — AMBULATORY NON FORMULARY MEDICATION: Status: AC

## 2015-07-22 NOTE — Telephone Encounter (Signed)
[facility] called requesting order to be written for them to draw BUN/Creatinine for his CT of head without contrast. Order printed and given to Dr. Nyoka Cowden to sign. To be faxed to [facility]

## 2015-07-24 LAB — BASIC METABOLIC PANEL
BUN: 17 mg/dL (ref 4–21)
Creatinine: 0.7 mg/dL (ref 0.6–1.3)

## 2015-07-30 ENCOUNTER — Telehealth: Payer: Self-pay

## 2015-07-30 NOTE — Telephone Encounter (Signed)
I called patient's number [phone] but had to leave a number. I spoke with Konrad Dolores (patient's son) and gave him the following results as per Dr. Rolly Salter instruction.   "No acute changes, cerebral atrophy and small vessel disease."  Son stated that he understood these results and that he did not have any questions at this time.

## 2015-08-14 ENCOUNTER — Other Ambulatory Visit: Payer: Medicare Other

## 2015-08-19 ENCOUNTER — Ambulatory Visit: Payer: Medicare Other | Admitting: Internal Medicine

## 2015-09-11 ENCOUNTER — Encounter: Payer: Medicare Other | Admitting: *Deleted

## 2015-09-11 ENCOUNTER — Telehealth: Payer: Self-pay | Admitting: Cardiology

## 2015-09-11 NOTE — Telephone Encounter (Signed)
LMOVM reminding pt to send remote transmission.   

## 2015-09-30 ENCOUNTER — Encounter: Payer: Self-pay | Admitting: Physician Assistant

## 2015-09-30 ENCOUNTER — Encounter (INDEPENDENT_AMBULATORY_CARE_PROVIDER_SITE_OTHER): Payer: Self-pay

## 2015-09-30 ENCOUNTER — Ambulatory Visit (INDEPENDENT_AMBULATORY_CARE_PROVIDER_SITE_OTHER): Payer: Medicare Other | Admitting: Physician Assistant

## 2015-09-30 VITALS — BP 100/60 | HR 85 | Ht 72.0 in | Wt 164.4 lb

## 2015-09-30 DIAGNOSIS — R600 Localized edema: Secondary | ICD-10-CM

## 2015-09-30 DIAGNOSIS — Z950 Presence of cardiac pacemaker: Secondary | ICD-10-CM | POA: Diagnosis not present

## 2015-09-30 DIAGNOSIS — Z45018 Encounter for adjustment and management of other part of cardiac pacemaker: Secondary | ICD-10-CM | POA: Diagnosis not present

## 2015-09-30 DIAGNOSIS — I495 Sick sinus syndrome: Secondary | ICD-10-CM | POA: Diagnosis not present

## 2015-09-30 DIAGNOSIS — I25709 Atherosclerosis of coronary artery bypass graft(s), unspecified, with unspecified angina pectoris: Secondary | ICD-10-CM

## 2015-09-30 DIAGNOSIS — R202 Paresthesia of skin: Secondary | ICD-10-CM

## 2015-09-30 LAB — BASIC METABOLIC PANEL
BUN: 14 mg/dL (ref 7–25)
CHLORIDE: 102 mmol/L (ref 98–110)
CO2: 26 mmol/L (ref 20–31)
Calcium: 8.5 mg/dL — ABNORMAL LOW (ref 8.6–10.3)
Creat: 0.88 mg/dL (ref 0.70–1.11)
GLUCOSE: 134 mg/dL — AB (ref 65–99)
POTASSIUM: 4.4 mmol/L (ref 3.5–5.3)
SODIUM: 142 mmol/L (ref 135–146)

## 2015-09-30 LAB — CBC
HEMATOCRIT: 37.2 % — AB (ref 38.5–50.0)
Hemoglobin: 12.3 g/dL — ABNORMAL LOW (ref 13.2–17.1)
MCH: 28.9 pg (ref 27.0–33.0)
MCHC: 33.1 g/dL (ref 32.0–36.0)
MCV: 87.5 fL (ref 80.0–100.0)
MPV: 9.3 fL (ref 7.5–12.5)
Platelets: 232 10*3/uL (ref 140–400)
RBC: 4.25 MIL/uL (ref 4.20–5.80)
RDW: 14.7 % (ref 11.0–15.0)
WBC: 7.4 10*3/uL (ref 3.8–10.8)

## 2015-09-30 LAB — TROPONIN I: Troponin I: <0.03 ng/mL (ref <0.03)

## 2015-09-30 MED ORDER — FUROSEMIDE 20 MG PO TABS: 20.0000 mg | ORAL_TABLET | Freq: Every day | ORAL | Status: AC | PRN

## 2015-09-30 MED ORDER — ASPIRIN EC 81 MG PO TBEC: 81.0000 mg | DELAYED_RELEASE_TABLET | Freq: Every day | ORAL | Status: AC

## 2015-09-30 NOTE — Patient Instructions (Signed)
Medication Instructions:  1) START Aspirin 81mg  once daily 2) START Lasix 20mg  once daily as needed for swelling  Labwork: Troponin, CBC and BMET today  Testing/Procedures: Your physician has requested that you have an echocardiogram. Echocardiography is a painless test that uses sound waves to create images of your heart. It provides your doctor with information about the size and shape of your heart and how well your heart's chambers and valves are working. This procedure takes approximately one hour. There are no restrictions for this procedure.   Follow-Up: Your physician wants you to follow-up in: 6 months with Dr. Caryl Comes.  You will receive a reminder letter in the mail two months in advance. If you don't receive a letter, please call our office to schedule the follow-up appointment.   Any Other Special Instructions Will Be Listed Below (If Applicable).     If you need a refill on your cardiac medications before your next appointment, please call your pharmacy.

## 2015-09-30 NOTE — Progress Notes (Signed)
Cardiology Office Note    Date:  09/30/2015   ID:  [facility], DOB Sep 09, 1926, MRN [id number]  PCP:  Dortha Kern, PA  Cardiologist:  Dr. Caryl Comes  Chief Complaint  Patient presents with  . Follow-up    seen for Dr. Caryl Comes    History of Present Illness:  Dustin Davila is a 82 y.o. male with history of CAD s/p CABG 02/10/2003 (LIMA to LAD, SVG to D1), ischemic cardiomyopathy, diabetes mellitus, sinus nodal dysfunction s/p Medtronic PPM 2006 with generator change 02/2015, GERD, hypertension, history of blastomycosis and left bundle branch block. His last echocardiogram obtained in August 2011 showed EF 50%. Myoview obtained in August 2015 demonstrate EF 48% without active ischemia. He was last seen by Dr. Caryl Comes in the office on 06/12/2015 at which time he complained of exercise intolerance in his legs. It was suspected some of this symptom is related to diabetic neuropathy and possibly related to poor conditioning as well. His device was reprogrammed to decreased ADL rate. He currently resides [employer]text[employer] in [place] (previously lived in [facility] living facility, however he was unable to care for himself independently).  He has been having some numbness and burning sensation in his right fourth and fifth digit. He did have some arm numbness along with dyspnea on exertion prior to bypass surgery which was his typical angina symptom. Current symptom seems to be different from his angina. He also has been having intermittent lower extremity edema, he has more edema in his right lower extremity left lower extremity. We will obtain some basic labs today include CBC and basic metabolic panel. I have recommended conservative therapy by placing 1 to 2 pillows under his legs at night, however if he fails, I have also written him a prescription for 20 mg daily PRN Lasix for increased swelling. We will also obtain an troponin, however I feel his numbness and burning  sensation in his fourth and fifth digit is likely not angina despite having history of arm numbness was previous angina, I think this is either progression of osteoarthritis or ulnar nerve impingement. I would obtain a trop in case there is a small chance this is cardiac. His last echocardiogram was in 2011, given new heart failure symptom, we will obtain a repeat echocardiogram. If echo looks normal, he can follow-up in 6 months.    Past Medical History  Diagnosis Date  . Allergy   . Ischemic cardiomyopathy s/p CABG 2004   . Anxiety   . Diabetes mellitus without complication ([place])   . Cataract   . Sinus node dysfunction (HCC)   . GERD (gastroesophageal reflux disease)   . Hypertension   . Metatarsalgia of both feet   . Debility, unspecified   . Gout, unspecified   . Urinary frequency   . Sebaceous cyst   . Other seborrheic keratosis   . Unspecified nonsenile cataract   . Pain in joint, shoulder region   . Cervicalgia   . Memory loss 07/20/2005  . Irritable bowel syndrome   . Neoplasm of uncertain behavior of lip, oral cavity, and pharynx   . Macular degeneration (senile) of retina, unspecified   . Syncope and collapse   . Blastomycosis   . Peptic ulcer, unspecified site, unspecified as acute or chronic, without mention of hemorrhage, perforation, or obstruction   . Blood in stool   . Insomnia, unspecified   . Left bundle branch block   . Elevated prostate specific antigen (PSA)   . Lipoma  of other skin and subcutaneous tissue   . Gastroparesis   . peripheral neuropathy   . Lumbago   . Carpal tunnel syndrome   . Osteoarthrosis, unspecified whether generalized or localized, unspecified site   . Achalasia and cardiospasm   . Pacemaker-Medtronic   . Diverticulitis of colon (without mention of hemorrhage)   . CORONARY ARTERY DISEASE 05/27/2008    Qualifier: Diagnosis of  By: Edison Pace RN, Tammy      Past Surgical History  Procedure Laterality Date  . Cataract extraction    .  Open heart surgery  12/1997    DR GAMBLE  . Eye surgery    . Vasectomy  1976  . Appendectomy  1995  . (r) ear implant      DR WELLS  . Nasal sinus surgery  1978    DR WELLS  . Shoulder arthroscopy w/ rotator cuff repair  1995    DR HARKINS  . Excise thrombosed hemorrhoid  07/1996    DR LEONE   . Minor hemorrhoidectomy  04/1998    DR LEONE   . Coronary artery bypass graft  01/2003    DR HENDRICKSON  . Pacemaker insertion  10/28/2004    DR MCQUEEN  . Nodules removed left arm  05/02/2006    DR GROSS  . Ep implantable device N/A 03/09/2015    Procedure:  PPM Generator Changeout / Battery Only;  Surgeon: Deboraha Sprang, MD;  Location: [facility];  Service: Cardiovascular;  Laterality: N/A;    Current Medications: Outpatient Prescriptions Prior to Visit  Medication Sig Dispense Refill  . AMBULATORY NON FORMULARY MEDICATION To Draw BUN/Creatinine prior to CT Head without Contrast 1 each 0  . carvedilol (COREG) 3.125 MG tablet Take 1 tablet (3.125 mg total) by mouth 2 (two) times daily. 60 tablet 3  . DULoxetine (CYMBALTA) 30 MG capsule One daily to help depression, arthritic pain, and insomnia 30 capsule 3  . finasteride (PROSCAR) 5 MG tablet Take 5 mg by mouth daily. Tke one tablet once a day to help with prostate    . glucose blood (FREESTYLE LITE) test strip 1 each by Other route as needed for other. Use as instructed, test blood sugar twice daily.    Marland Kitchen ibuprofen (ADVIL,MOTRIN) 200 MG tablet Take 200 mg by mouth at bedtime.    . Lancets (FREESTYLE) lancets 1 each by Other route as needed for other. Use as instructed    . metformin (FORTAMET) 500 MG (OSM) 24 hr tablet Take 500 mg by mouth daily with breakfast. To help control diabetes    . omeprazole (PRILOSEC) 40 MG capsule Take 40 mg by mouth daily.    . polyethylene glycol (MIRALAX / GLYCOLAX) packet Take 17 grams in 8 oz of water daily as needed for constipation.    . simvastatin (ZOCOR) 40 MG tablet TAKE 1 TABLET BY  MOUTH ONCE DAILY TO LOWER CHOLESTEROL 90 tablet 1  . zolpidem (AMBIEN) 10 MG tablet Take one tablet by mouth once nightly at bedtime as needed for sleep (Patient not taking: Reported on 09/30/2015) 30 tablet 0   No facility-administered medications prior to visit.     Allergies:   Aricept; Cymbalta; Feldene; Namenda; Ultram; Altace; and Codeine   Social History   Social History  . Marital Status: Single    Spouse Name: N/A  . Number of Children: N/A  . Years of Education: N/A   Social History Main Topics  . Smoking status: Former Smoker  Types: Cigarettes    Quit date: 03/28/1938  . Smokeless tobacco: Never Used  . Alcohol Use: No  . Drug Use: No  . Sexual Activity: No   Other Topics Concern  . None   Social History Narrative   Wife died of Alzheimer's on 02/06/14.     Family History:  The patient's family history includes Arthritis in his brother; Cancer in his mother and sister; Heart attack in his father; Heart disease in his father and sister; Kidney disease in his brother.   ROS:   Please see the history of present illness.    ROS All other systems reviewed and are negative.   PHYSICAL EXAM:   VS:  BP 100/60 mmHg  Pulse 85  Ht 6' (1.829 m)  Wt 164 lb 6.4 oz (74.571 kg)  BMI 22.29 kg/m2   GEN: Well nourished, well developed, in no acute distress HEENT: normal Neck: no JVD, carotid bruits, or masses Cardiac: RRR; no rubs, or gallops 1/6 systolic murmur. Bilateral LE edema, R > L, 2-3+ pitting edema on R, 1-2+ pitting edema on L. Respiratory:  clear to auscultation bilaterally, normal work of breathing GI: soft, nontender, nondistended, + BS MS: no deformity or atrophy Skin: warm and dry, no rash Neuro:  Alert and Oriented x 3, Strength and sensation are intact Psych: euthymic mood, full affect  Wt Readings from Last 3 Encounters:  09/30/15 164 lb 6.4 oz (74.571 kg)  07/21/15 171 lb (77.565 kg)  06/12/15 176 lb 12.8 oz (80.196 kg)      Studies/Labs  Reviewed:   EKG:  EKG is ordered today.  The ekg ordered today demonstrates NSR with LBBB unchanged from prior EKG  Recent Labs: 02/23/2015: Hemoglobin 14.6; Platelets 146* 04/20/2015: Potassium 3.6; Sodium 148* 07/24/2015: BUN 17; Creatinine 0.7   Lipid Panel    Component Value Date/Time   CHOL 124 04/20/2015 0903   TRIG 102 04/20/2015 0903   HDL 48 04/20/2015 0903   CHOLHDL 2.6 04/20/2015 0903   LDLCALC 56 04/20/2015 0903    Additional studies/ records that were reviewed today include:   CABG 02/10/2003 (LIMA to LAD, SVG to D1) by Dr. Roxan Hockey   Myoview 10/30/2013   Overall Impression: Low risk stress nuclear study with a fixed peri-apical perfusion defect and no evidence for significant ischemia. Wall motion abnormality pattern matches the perfusion defects. Suspect prior infarction. .  LV Ejection Fraction: 48%. LV Wall Motion: Peri-apical akinesis   Device change out:  02/2015 Medtronic pulse generator, serial number [id number]    ASSESSMENT:    1. Bilateral edema of lower extremity   2. Pacemaker reprogramming/check   3. Sinus node dysfunction (HCC)   4. Cardiac pacemaker in situ   5. Coronary artery disease involving coronary bypass graft of native heart with unspecified angina pectoris   6. Right hand paresthesia      PLAN:  In order of problems listed above:  1. CAD s/p CABG 02/10/2003 (LIMA to LAD, SVG to D1) - chronic LBBB on EKG, no change today. Has been having some R fourth and fifth digit tingling and burning sensation. Review of his previous cardiology report shows that his anginal symptom in 2004 was DOE and arm tingling. But his current R hand numbness and tingling is different from prior angina. He denies any CP. I think his symptom is likely progressive OA or ulnar nerve impingement. Will check a trop. Add 81mg  daily ASA.  2. LE edema: he has at least 2+ pitting edema in  his RLE and 1+ pitting edema in LLE, I recommended conservative therapy  with pillow under the leg for now. I have written him a Rx for PRN daily 20mg  lasix in case conservative therapy fail. I am not sure why he is having increasing LE edema, will repeat an echocardiogram to make sure is unchanged. Otherwise his lung is clear, and has no sign of pulm edema. Majority of increased fluid seems to be in his LE.  3. diabetes mellitus II: on metformin  4. Sinus nodal dysfunction s/p Medtronic PPM 2006 with generator change 02/2015: last interrogation in 05/2015 showed normal device function with 11-14 yrs longevity. Followup in 6 month.   5. Hypertension: BP borderline on minimal dose of coreg. Also on finesteride    Medication Adjustments/Labs and Tests Ordered: Current medicines are reviewed at length with the patient today.  Concerns regarding medicines are outlined above.  Medication changes, Labs and Tests ordered today are listed in the Patient Instructions below. Patient Instructions  Medication Instructions:  1) START Aspirin 81mg  once daily 2) START Lasix 20mg  once daily as needed for swelling  Labwork: Troponin, CBC and BMET today  Testing/Procedures: Your physician has requested that you have an echocardiogram. Echocardiography is a painless test that uses sound waves to create images of your heart. It provides your doctor with information about the size and shape of your heart and how well your heart's chambers and valves are working. This procedure takes approximately one hour. There are no restrictions for this procedure.   Follow-Up: Your physician wants you to follow-up in: 6 months with Dr. Caryl Comes.  You will receive a reminder letter in the mail two months in advance. If you don't receive a letter, please call our office to schedule the follow-up appointment.   Any Other Special Instructions Will Be Listed Below (If Applicable).     If you need a refill on your cardiac medications before your next appointment, please call your pharmacy.         Hilbert Corrigan, [place]  09/30/2015 7:25 PM    [facility] [address], [place], [place]  [address] Phone: [phone]; Fax: [phone]

## 2015-10-16 ENCOUNTER — Other Ambulatory Visit: Payer: Self-pay

## 2015-10-16 ENCOUNTER — Ambulatory Visit (HOSPITAL_COMMUNITY): Payer: Medicare Other | Attending: Cardiovascular Disease

## 2015-10-16 DIAGNOSIS — I2510 Atherosclerotic heart disease of native coronary artery without angina pectoris: Secondary | ICD-10-CM | POA: Insufficient documentation

## 2015-10-16 DIAGNOSIS — Z951 Presence of aortocoronary bypass graft: Secondary | ICD-10-CM | POA: Diagnosis not present

## 2015-10-16 DIAGNOSIS — I10 Essential (primary) hypertension: Secondary | ICD-10-CM | POA: Insufficient documentation

## 2015-10-16 DIAGNOSIS — Z8249 Family history of ischemic heart disease and other diseases of the circulatory system: Secondary | ICD-10-CM | POA: Insufficient documentation

## 2015-10-16 DIAGNOSIS — I255 Ischemic cardiomyopathy: Secondary | ICD-10-CM | POA: Insufficient documentation

## 2015-10-16 DIAGNOSIS — E785 Hyperlipidemia, unspecified: Secondary | ICD-10-CM | POA: Insufficient documentation

## 2015-10-16 DIAGNOSIS — Z87891 Personal history of nicotine dependence: Secondary | ICD-10-CM | POA: Insufficient documentation

## 2015-10-16 DIAGNOSIS — I351 Nonrheumatic aortic (valve) insufficiency: Secondary | ICD-10-CM | POA: Insufficient documentation

## 2015-10-16 DIAGNOSIS — I447 Left bundle-branch block, unspecified: Secondary | ICD-10-CM | POA: Insufficient documentation

## 2015-10-16 DIAGNOSIS — E119 Type 2 diabetes mellitus without complications: Secondary | ICD-10-CM | POA: Insufficient documentation

## 2015-10-16 DIAGNOSIS — I25709 Atherosclerosis of coronary artery bypass graft(s), unspecified, with unspecified angina pectoris: Secondary | ICD-10-CM | POA: Diagnosis not present

## 2016-07-26 DEATH — deceased
